# Patient Record
Sex: Female | Born: 2005 | Race: White | Hispanic: Yes | Marital: Single | Smoking: Current some day smoker
Health system: Southern US, Community
[De-identification: ages and names within clinical notes are randomized; demographics above are authoritative.]

## PROBLEM LIST (undated history)

## (undated) DIAGNOSIS — Z87440 Personal history of urinary (tract) infections: Secondary | ICD-10-CM

## (undated) DIAGNOSIS — N12 Tubulo-interstitial nephritis, not specified as acute or chronic: Secondary | ICD-10-CM

## (undated) DIAGNOSIS — T7840XA Allergy, unspecified, initial encounter: Secondary | ICD-10-CM

## (undated) HISTORY — PX: NO PAST SURGERIES: SHX2092

## (undated) HISTORY — DX: Allergy, unspecified, initial encounter: T78.40XA

## (undated) HISTORY — PX: APPENDECTOMY: SHX54

---
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
## 2023-01-05 DIAGNOSIS — Z419 Encounter for procedure for purposes other than remedying health state, unspecified: Secondary | ICD-10-CM | POA: Diagnosis not present

## 2023-01-25 ENCOUNTER — Telehealth: Payer: Self-pay | Admitting: Obstetrics and Gynecology

## 2023-01-25 NOTE — Telephone Encounter (Signed)
Left message for patient to call office back to r/s appt with Dr Amalia Hailey on 02/05/23. [facility] message sent

## 2023-02-01 DIAGNOSIS — Z20818 Contact with and (suspected) exposure to other bacterial communicable diseases: Secondary | ICD-10-CM | POA: Diagnosis not present

## 2023-02-01 DIAGNOSIS — N760 Acute vaginitis: Secondary | ICD-10-CM | POA: Diagnosis not present

## 2023-02-01 DIAGNOSIS — J029 Acute pharyngitis, unspecified: Secondary | ICD-10-CM | POA: Diagnosis not present

## 2023-02-01 IMAGING — DX DG CHEST 1V PORT
1 series · 1 of 1 positions shown · non-contrast
Comparison: 10/16/2005

CLINICAL DATA: Sepsis

EXAM:
PORTABLE CHEST 1 VIEW

[chest ap]
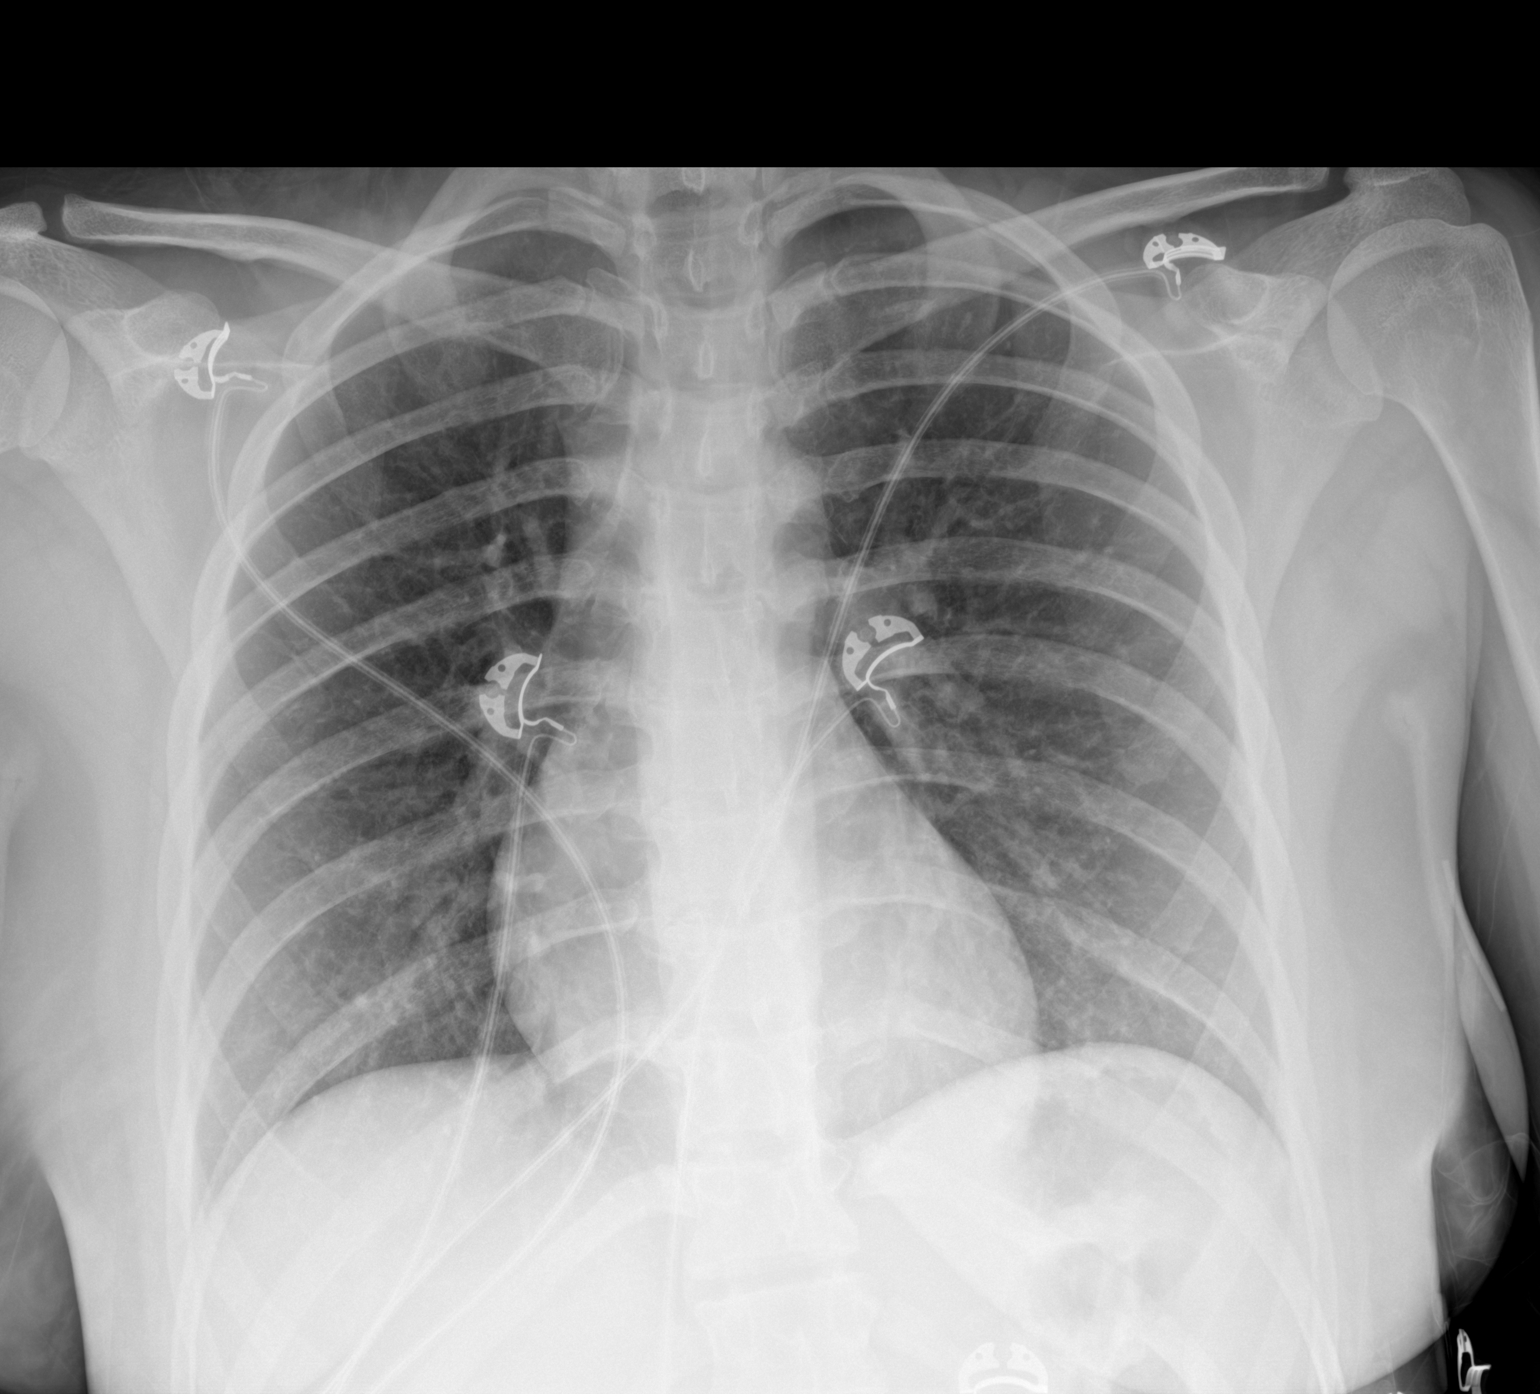

[1 of 1 positions shown; findings below may reference images not displayed]

FINDINGS: The heart size and mediastinal contours are within normal limits.
Both lungs are clear. The visualized skeletal structures are
unremarkable.
IMPRESSION: No active disease.

## 2023-02-01 IMAGING — CT CT ABD-PELV W/ CM
2 of 4 series · 16 of 46 positions shown, 18 images · IV contrast (omnipaque)
Comparison: None.

CLINICAL DATA: Fever, left upper quadrant abdominal pain

EXAM:
CT ABDOMEN AND PELVIS WITH CONTRAST
TECHNIQUE: Multidetector CT imaging of the abdomen and pelvis was performed
using the standard protocol following bolus administration of
intravenous contrast.
CONTRAST:  100mL OMNIPAQUE IOHEXOL 300 MG/ML  SOLN

[Series 2: routine abd/pel with · axial · 0.88mm/px · z∈[-1259,-759]mm · 13 of 110 slices shown, 15 images]
[im 5/110  soft-tissue]
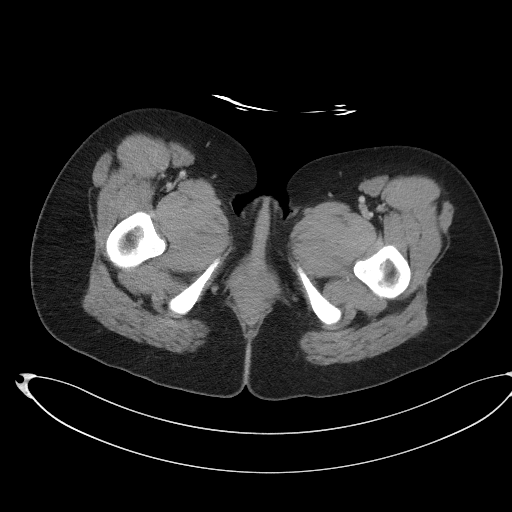
[im 5/110  bone]
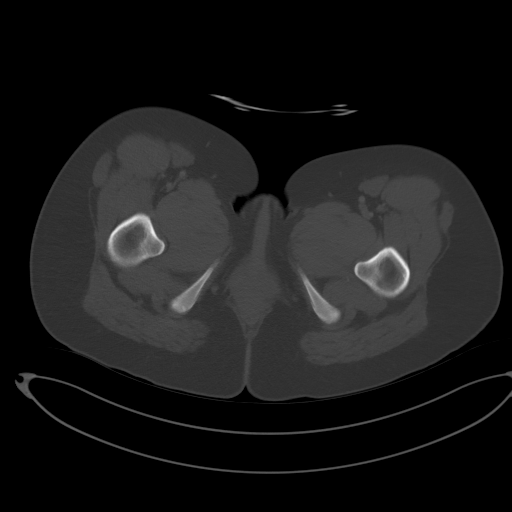
[im 14/110  soft-tissue]
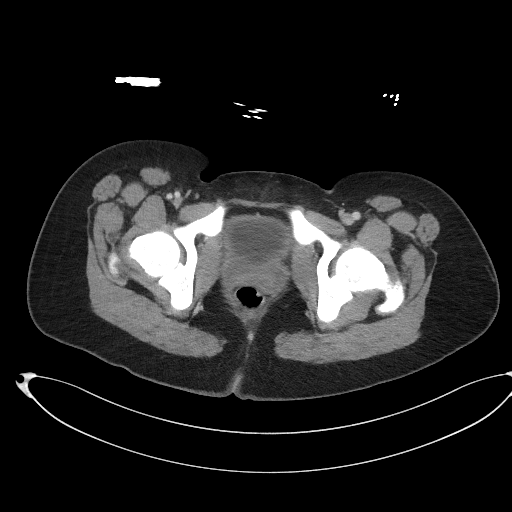
[im 22/110  soft-tissue]
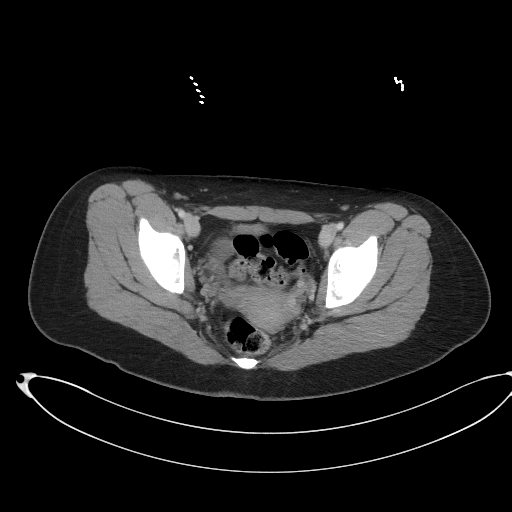
[im 31/110  soft-tissue]
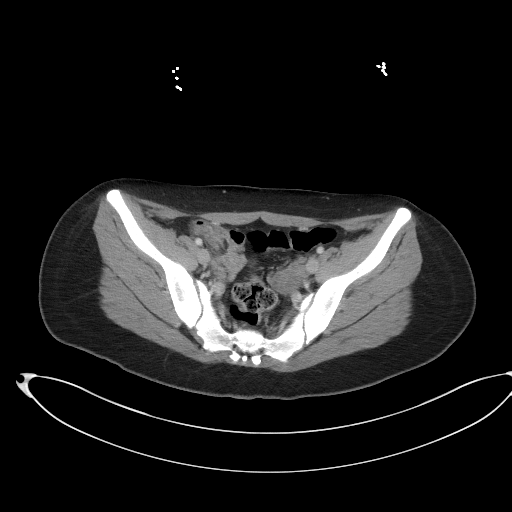
[im 40/110  soft-tissue]
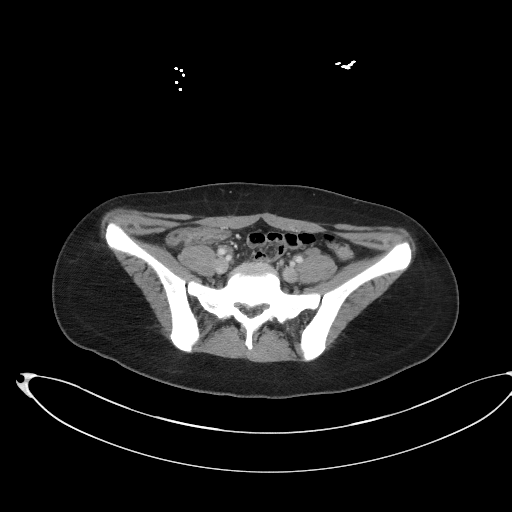
[im 48/110  soft-tissue]
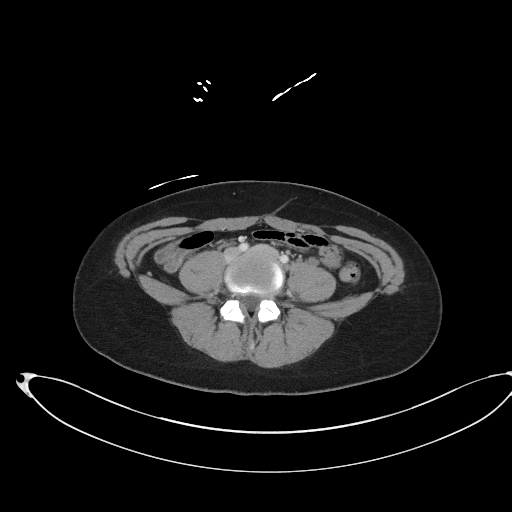
[im 57/110  soft-tissue]
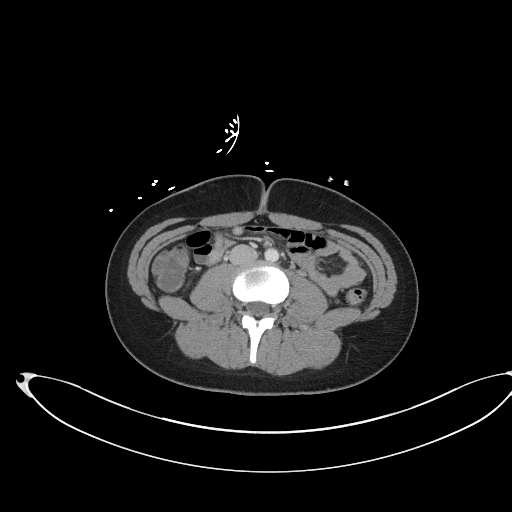
[im 62/110  soft-tissue]
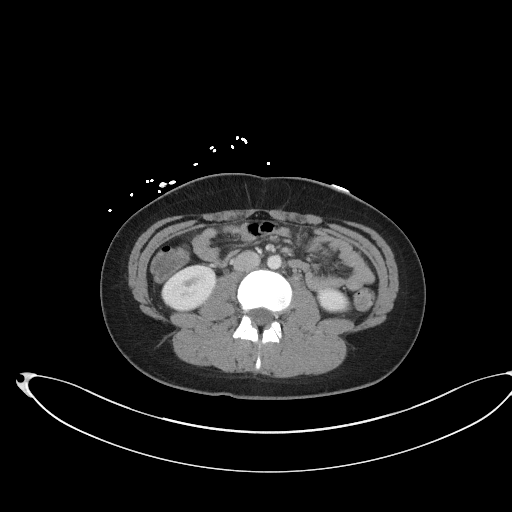
[im 70/110  soft-tissue]
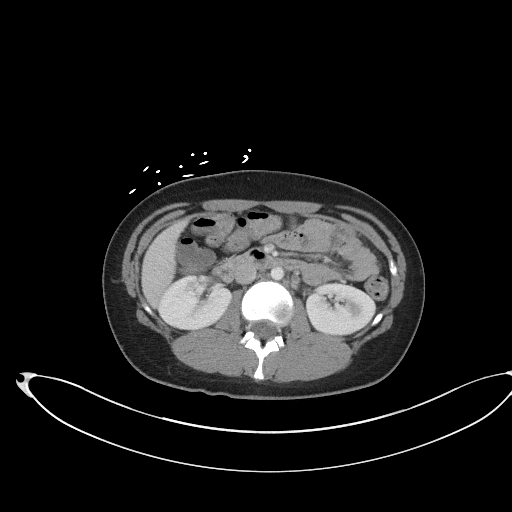
[im 70/110  bone]
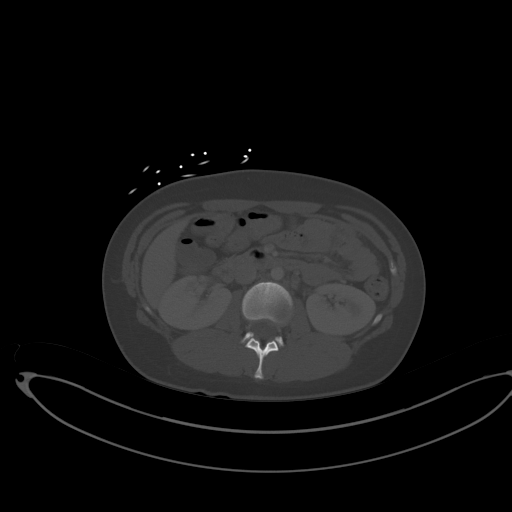
[im 79/110  soft-tissue]
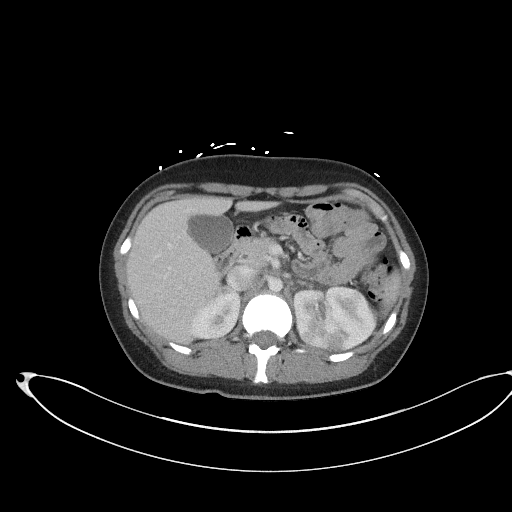
[im 88/110  soft-tissue]
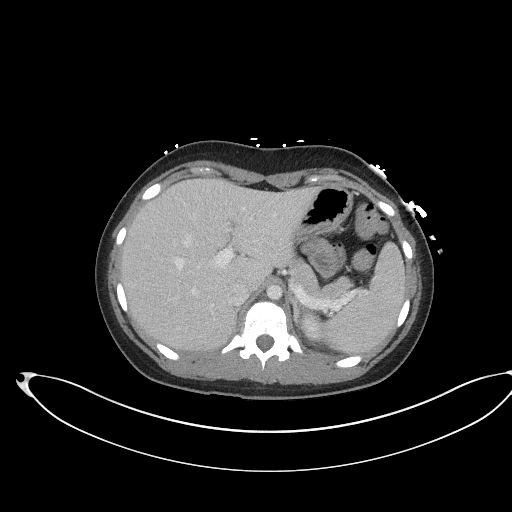
[im 96/110  soft-tissue]
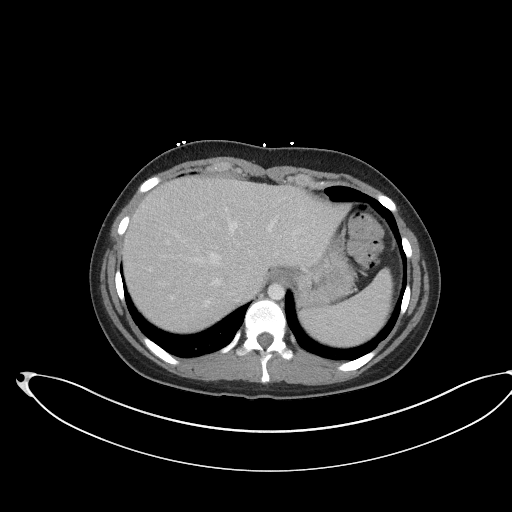
[im 105/110  soft-tissue]
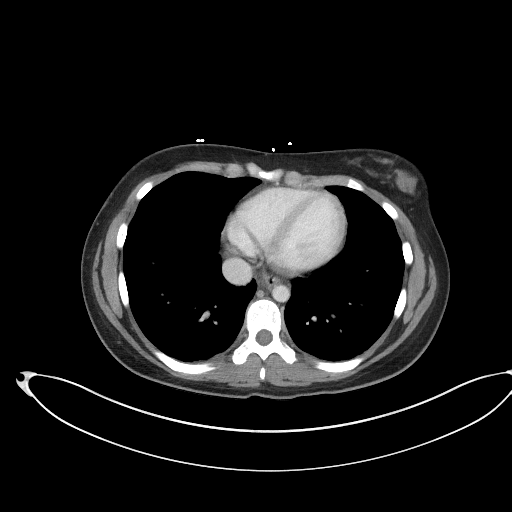

[Series 5: coronal st · coronal · 0.83mm/px · 3 of 89 slices shown]
[im 30/89  soft-tissue]
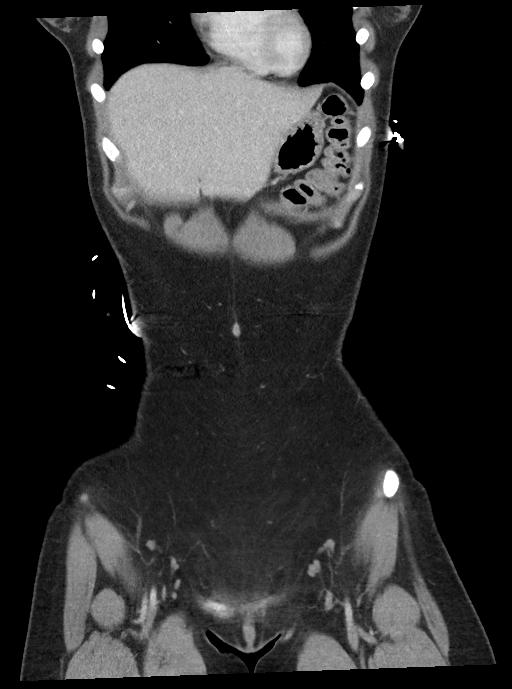
[im 40/89  soft-tissue]
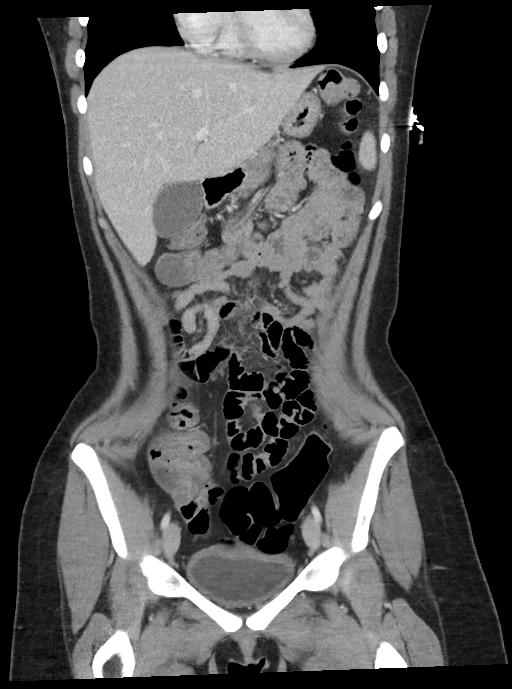
[im 49/89  soft-tissue]
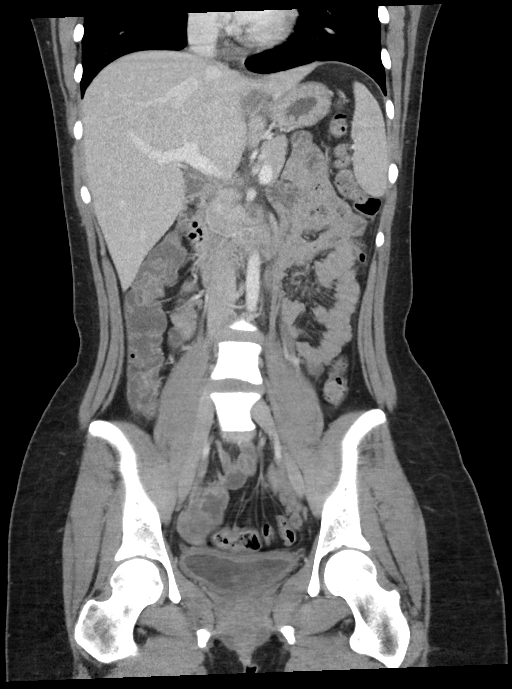

[16 of 46 positions shown; findings below may reference images not displayed]

FINDINGS: Lower chest: No acute abnormality.

Hepatobiliary: No focal liver abnormality is seen. No gallstones,
gallbladder wall thickening, or biliary dilatation.

Pancreas: Unremarkable

Spleen: Unremarkable

Adrenals/Urinary Tract: The adrenal glands are unremarkable. The
kidneys are normal in size and position. There is heterogeneous
cortical enhancement of the left kidney with areas of segmental
hypoenhancement most in keeping with changes of pyelonephritis.
Trace left perinephric inflammatory stranding. No hydronephrosis. No
intrarenal or ureteral calculi. The bladder wall is
circumferentially mildly thickened suggesting changes of underlying
cystitis.

Stomach/Bowel: Stomach is within normal limits. Appendix appears
normal. No evidence of bowel wall thickening, distention, or
inflammatory changes. No free intraperitoneal gas or fluid.

Vascular/Lymphatic: No significant vascular findings are present. No
enlarged abdominal or pelvic lymph nodes.

Reproductive: Uterus and bilateral adnexa are unremarkable.

Other: No abdominal wall hernia.  Rectum unremarkable.

Musculoskeletal: No acute bone abnormality. No lytic or blastic bone
lesions are identified.
IMPRESSION: Heterogeneous enhancement of the left kidney and circumferential
bladder wall thickening most in keeping with changes of
pyelonephritis/cystitis. Correlation with urinalysis and urine
culture is recommended.

## 2023-02-05 ENCOUNTER — Encounter: Payer: Medicaid Other | Admitting: Obstetrics and Gynecology

## 2023-02-05 DIAGNOSIS — Z419 Encounter for procedure for purposes other than remedying health state, unspecified: Secondary | ICD-10-CM | POA: Diagnosis not present

## 2023-03-07 DIAGNOSIS — Z419 Encounter for procedure for purposes other than remedying health state, unspecified: Secondary | ICD-10-CM | POA: Diagnosis not present

## 2023-04-04 ENCOUNTER — Other Ambulatory Visit: Payer: Self-pay

## 2023-04-04 ENCOUNTER — Emergency Department (HOSPITAL_BASED_OUTPATIENT_CLINIC_OR_DEPARTMENT_OTHER): Payer: Medicaid Other

## 2023-04-04 ENCOUNTER — Inpatient Hospital Stay (HOSPITAL_BASED_OUTPATIENT_CLINIC_OR_DEPARTMENT_OTHER)
Admission: EM | Admit: 2023-04-04 | Discharge: 2023-04-06 | DRG: 749 | Disposition: A | Discharge status: Home / Self Care | Payer: Medicaid Other | Attending: Surgery | Admitting: Surgery

## 2023-04-04 ENCOUNTER — Encounter (HOSPITAL_BASED_OUTPATIENT_CLINIC_OR_DEPARTMENT_OTHER): Payer: Self-pay | Admitting: Emergency Medicine

## 2023-04-04 DIAGNOSIS — K661 Hemoperitoneum: Secondary | ICD-10-CM | POA: Diagnosis present

## 2023-04-04 DIAGNOSIS — N3289 Other specified disorders of bladder: Secondary | ICD-10-CM | POA: Diagnosis not present

## 2023-04-04 DIAGNOSIS — N83202 Unspecified ovarian cyst, left side: Secondary | ICD-10-CM

## 2023-04-04 DIAGNOSIS — Z833 Family history of diabetes mellitus: Secondary | ICD-10-CM

## 2023-04-04 DIAGNOSIS — B9689 Other specified bacterial agents as the cause of diseases classified elsewhere: Secondary | ICD-10-CM | POA: Diagnosis not present

## 2023-04-04 DIAGNOSIS — R110 Nausea: Secondary | ICD-10-CM | POA: Diagnosis not present

## 2023-04-04 DIAGNOSIS — R1030 Lower abdominal pain, unspecified: Secondary | ICD-10-CM | POA: Diagnosis not present

## 2023-04-04 DIAGNOSIS — Z87440 Personal history of urinary (tract) infections: Secondary | ICD-10-CM

## 2023-04-04 DIAGNOSIS — N760 Acute vaginitis: Secondary | ICD-10-CM | POA: Diagnosis not present

## 2023-04-04 DIAGNOSIS — R102 Pelvic and perineal pain: Secondary | ICD-10-CM | POA: Diagnosis not present

## 2023-04-04 DIAGNOSIS — F32A Depression, unspecified: Secondary | ICD-10-CM | POA: Diagnosis present

## 2023-04-04 DIAGNOSIS — K529 Noninfective gastroenteritis and colitis, unspecified: Secondary | ICD-10-CM | POA: Diagnosis present

## 2023-04-04 DIAGNOSIS — F1290 Cannabis use, unspecified, uncomplicated: Secondary | ICD-10-CM | POA: Diagnosis present

## 2023-04-04 DIAGNOSIS — R109 Unspecified abdominal pain: Secondary | ICD-10-CM | POA: Diagnosis present

## 2023-04-04 DIAGNOSIS — Z823 Family history of stroke: Secondary | ICD-10-CM

## 2023-04-04 DIAGNOSIS — R1031 Right lower quadrant pain: Secondary | ICD-10-CM | POA: Diagnosis not present

## 2023-04-04 DIAGNOSIS — F17290 Nicotine dependence, other tobacco product, uncomplicated: Secondary | ICD-10-CM | POA: Diagnosis present

## 2023-04-04 DIAGNOSIS — N83292 Other ovarian cyst, left side: Principal | ICD-10-CM | POA: Diagnosis present

## 2023-04-04 LAB — URINALYSIS, MICROSCOPIC (REFLEX)

## 2023-04-04 LAB — URINALYSIS, ROUTINE W REFLEX MICROSCOPIC
Bilirubin Urine: NEGATIVE
Glucose, UA: NEGATIVE mg/dL
Ketones, ur: NEGATIVE mg/dL
Nitrite: NEGATIVE
Protein, ur: 30 mg/dL — AB
Specific Gravity, Urine: >=1.03 (ref 1.005–1.030)
pH: 7 (ref 5.0–8.0)

## 2023-04-04 LAB — LACTIC ACID, PLASMA
Lactic Acid, Venous: 1 mmol/L (ref 0.5–1.9)
Lactic Acid, Venous: 1.2 mmol/L (ref 0.5–1.9)

## 2023-04-04 LAB — COMPREHENSIVE METABOLIC PANEL
ALT: 14 U/L (ref 0–44)
AST: 17 U/L (ref 15–41)
Albumin: 3.9 g/dL (ref 3.5–5.0)
Alkaline Phosphatase: 59 U/L (ref 38–126)
Anion gap: 9 (ref 5–15)
BUN: 11 mg/dL (ref 6–20)
CO2: 25 mmol/L (ref 22–32)
Calcium: 9 mg/dL (ref 8.9–10.3)
Chloride: 103 mmol/L (ref 98–111)
Creatinine, Ser: 0.99 mg/dL (ref 0.44–1.00)
GFR, Estimated: >60 mL/min (ref >60)
Glucose, Bld: 125 mg/dL — ABNORMAL HIGH (ref 70–99)
Potassium: 3.9 mmol/L (ref 3.5–5.1)
Sodium: 137 mmol/L (ref 135–145)
Total Bilirubin: 0.8 mg/dL (ref 0.3–1.2)
Total Protein: 7.7 g/dL (ref 6.5–8.1)

## 2023-04-04 LAB — WET PREP, GENITAL
Sperm: NONE SEEN
Trich, Wet Prep: NONE SEEN
WBC, Wet Prep HPF POC: >=10 — AB (ref <10)
Yeast Wet Prep HPF POC: NONE SEEN

## 2023-04-04 LAB — CBC WITH DIFFERENTIAL/PLATELET
Abs Immature Granulocytes: 0.21 10*3/uL — ABNORMAL HIGH (ref 0.00–0.07)
Basophils Absolute: 0.1 10*3/uL (ref 0.0–0.1)
Basophils Relative: 0 %
Eosinophils Absolute: 0 10*3/uL (ref 0.0–0.5)
Eosinophils Relative: 0 %
HCT: 40.5 % (ref 36.0–46.0)
Hemoglobin: 13.2 g/dL (ref 12.0–15.0)
Immature Granulocytes: 1 %
Lymphocytes Relative: 4 %
Lymphs Abs: 1.1 10*3/uL (ref 0.7–4.0)
MCH: 31.4 pg (ref 26.0–34.0)
MCHC: 32.6 g/dL (ref 30.0–36.0)
MCV: 96.2 fL (ref 80.0–100.0)
Monocytes Absolute: 1.5 10*3/uL — ABNORMAL HIGH (ref 0.1–1.0)
Monocytes Relative: 5 %
Neutro Abs: 27.6 10*3/uL — ABNORMAL HIGH (ref 1.7–7.7)
Neutrophils Relative %: 90 %
Platelets: 279 10*3/uL (ref 150–400)
RBC: 4.21 MIL/uL (ref 3.87–5.11)
RDW: 11.9 % (ref 11.5–15.5)
Smear Review: NORMAL
WBC: 30.5 10*3/uL — ABNORMAL HIGH (ref 4.0–10.5)
nRBC: 0 % (ref 0.0–0.2)

## 2023-04-04 LAB — PREGNANCY, URINE: Preg Test, Ur: NEGATIVE

## 2023-04-04 MED ORDER — KETOROLAC TROMETHAMINE 15 MG/ML IJ SOLN
15.0000 mg | Freq: Once | INTRAMUSCULAR | Status: AC
  Administered 2023-04-04: 15 mg via INTRAVENOUS
  Filled 2023-04-04: qty 1

## 2023-04-04 MED ORDER — SODIUM CHLORIDE 0.9 % IV SOLN
2.0000 g | Freq: Once | INTRAVENOUS | Status: AC
  Administered 2023-04-04: 2 g via INTRAVENOUS
  Filled 2023-04-04: qty 20

## 2023-04-04 MED ORDER — MORPHINE SULFATE (PF) 4 MG/ML IV SOLN
4.0000 mg | Freq: Once | INTRAVENOUS | Status: AC
  Administered 2023-04-04: 4 mg via INTRAVENOUS
  Filled 2023-04-04: qty 1

## 2023-04-04 MED ORDER — ACETAMINOPHEN 500 MG PO TABS
1000.0000 mg | ORAL_TABLET | Freq: Four times a day (QID) | ORAL | Status: DC
  Administered 2023-04-04 – 2023-04-06 (×5): 1000 mg via ORAL
  Filled 2023-04-04 (×5): qty 2

## 2023-04-04 MED ORDER — IOHEXOL 300 MG/ML  SOLN
100.0000 mL | Freq: Once | INTRAMUSCULAR | Status: AC | PRN
  Administered 2023-04-04: 100 mL via INTRAVENOUS

## 2023-04-04 MED ORDER — DIPHENHYDRAMINE HCL 50 MG/ML IJ SOLN: 25.0000 mg | Freq: Four times a day (QID) | INTRAMUSCULAR | Status: DC | PRN

## 2023-04-04 MED ORDER — MORPHINE SULFATE (PF) 2 MG/ML IV SOLN
2.0000 mg | INTRAVENOUS | Status: DC | PRN
  Administered 2023-04-04 (×2): 2 mg via INTRAVENOUS
  Filled 2023-04-04 (×2): qty 1

## 2023-04-04 MED ORDER — IBUPROFEN 200 MG PO TABS
600.0000 mg | ORAL_TABLET | Freq: Four times a day (QID) | ORAL | Status: DC | PRN
  Administered 2023-04-04: 600 mg via ORAL
  Filled 2023-04-04: qty 3

## 2023-04-04 MED ORDER — TRAMADOL HCL 50 MG PO TABS: 50.0000 mg | ORAL_TABLET | Freq: Four times a day (QID) | ORAL | Status: DC | PRN

## 2023-04-04 MED ORDER — ONDANSETRON HCL 4 MG/2ML IJ SOLN
4.0000 mg | Freq: Once | INTRAMUSCULAR | Status: AC
  Administered 2023-04-04: 4 mg via INTRAVENOUS
  Filled 2023-04-04: qty 2

## 2023-04-04 MED ORDER — METRONIDAZOLE 500 MG/100ML IV SOLN: 500.0000 mg | Freq: Two times a day (BID) | INTRAVENOUS | Status: DC

## 2023-04-04 MED ORDER — SODIUM CHLORIDE 0.9 % IV BOLUS (SEPSIS)
1000.0000 mL | Freq: Once | INTRAVENOUS | Status: AC
  Administered 2023-04-04: 1000 mL via INTRAVENOUS

## 2023-04-04 MED ORDER — SODIUM CHLORIDE 0.9 % IV SOLN
2.0000 g | INTRAVENOUS | Status: DC
  Administered 2023-04-05 – 2023-04-06 (×2): 2 g via INTRAVENOUS
  Filled 2023-04-04 (×2): qty 20

## 2023-04-04 MED ORDER — ONDANSETRON 4 MG PO TBDP
4.0000 mg | ORAL_TABLET | Freq: Four times a day (QID) | ORAL | Status: DC | PRN
  Administered 2023-04-04: 4 mg via ORAL
  Filled 2023-04-04 (×2): qty 1

## 2023-04-04 MED ORDER — KETOROLAC TROMETHAMINE 30 MG/ML IJ SOLN
15.0000 mg | Freq: Once | INTRAMUSCULAR | Status: AC
  Administered 2023-04-04: 15 mg via INTRAVENOUS
  Filled 2023-04-04: qty 1

## 2023-04-04 MED ORDER — ACETAMINOPHEN 325 MG PO TABS: 650.0000 mg | ORAL_TABLET | Freq: Four times a day (QID) | ORAL | Status: DC | PRN

## 2023-04-04 MED ORDER — SODIUM CHLORIDE 0.9 % IV SOLN: 2.0000 g | INTRAVENOUS | Status: DC

## 2023-04-04 MED ORDER — DOCUSATE SODIUM 100 MG PO CAPS
100.0000 mg | ORAL_CAPSULE | Freq: Two times a day (BID) | ORAL | Status: DC
  Administered 2023-04-04 – 2023-04-06 (×5): 100 mg via ORAL
  Filled 2023-04-04 (×5): qty 1

## 2023-04-04 MED ORDER — SODIUM CHLORIDE 0.9 % IV SOLN: Freq: Once | INTRAVENOUS | Status: DC

## 2023-04-04 MED ORDER — ACETAMINOPHEN 650 MG RE SUPP: 650.0000 mg | Freq: Four times a day (QID) | RECTAL | Status: DC | PRN

## 2023-04-04 MED ORDER — LACTATED RINGERS IV BOLUS
1000.0000 mL | Freq: Once | INTRAVENOUS | Status: AC
Start: 2023-04-04 — End: 2023-04-04
  Administered 2023-04-04: 1000 mL via INTRAVENOUS

## 2023-04-04 MED ORDER — DIPHENHYDRAMINE HCL 25 MG PO CAPS: 25.0000 mg | ORAL_CAPSULE | Freq: Four times a day (QID) | ORAL | Status: DC | PRN

## 2023-04-04 MED ORDER — OXYCODONE HCL 5 MG PO TABS
5.0000 mg | ORAL_TABLET | ORAL | Status: DC | PRN
  Administered 2023-04-04 – 2023-04-05 (×2): 5 mg via ORAL
  Filled 2023-04-04 (×2): qty 1

## 2023-04-04 MED ORDER — METRONIDAZOLE 500 MG/100ML IV SOLN
500.0000 mg | Freq: Once | INTRAVENOUS | Status: AC
  Administered 2023-04-04: 500 mg via INTRAVENOUS
  Filled 2023-04-04: qty 100

## 2023-04-04 MED ORDER — ONDANSETRON HCL 4 MG/2ML IJ SOLN
4.0000 mg | Freq: Four times a day (QID) | INTRAMUSCULAR | Status: DC | PRN
  Administered 2023-04-05: 4 mg via INTRAVENOUS
  Filled 2023-04-04: qty 2

## 2023-04-04 MED ORDER — METRONIDAZOLE 500 MG/100ML IV SOLN
500.0000 mg | Freq: Two times a day (BID) | INTRAVENOUS | Status: DC
  Administered 2023-04-04 – 2023-04-05 (×3): 500 mg via INTRAVENOUS
  Filled 2023-04-04 (×4): qty 100

## 2023-04-04 MED ORDER — ONDANSETRON HCL 4 MG/2ML IJ SOLN: 4.0000 mg | Freq: Once | INTRAMUSCULAR | Status: DC

## 2023-04-04 NOTE — ED Notes (Signed)
Patient transported to CT 

## 2023-04-04 NOTE — ED Provider Notes (Addendum)
[place] [facility] Provider Note   CSN: [id number] Arrival date & time: 04/04/23  1308     History  Chief Complaint  Patient presents with   Abdominal Pain    Lisa Abbott is a 17 y.o. female.  With PMH of recurrent UTIs and previous pyelonephritis, anxiety, marijuana use who presents with lower abdominal pain.  Patient says she has been having stabbing and cramping lower abdominal pain worse on the right side since yesterday afternoon.  She has had no injuries to the abdomen.  She has had associated nausea but no vomiting.  She just darted her menstrual period.  She denies any abnormal vaginal discharge burning or itching.  She is sexually active without protection but denies any concern for STD or new partners.  She has had no fevers, no chills, no vomiting, no diarrhea.  She does not have dysuria.  She does not take any medicines for the pain.  She reports a history of pyelonephritis in the past.  She took a dose of leftover ciprofloxacin this morning.  No history of abdominal surgery.  Does not endorse any flank or back pain at this time.  No coughing or shortness of breath.   Abdominal Pain      Home Medications Prior to Admission medications   Medication Sig Start Date End Date Taking? Authorizing Provider  metroNIDAZOLE (FLAGYL) 500 MG tablet Take 1 tablet (500 mg total) by mouth 2 (two) times daily. Patient not taking: Reported on 04/04/2023 10/19/22   Mecum, Oswaldo Conroy, PA-C  venlafaxine XR (EFFEXOR XR) 37.5 MG 24 hr capsule Take 1 capsule (37.5 mg total) by mouth daily with breakfast. Patient not taking: Reported on 04/04/2023 10/16/22   Mecum, Erin E, PA-C      Allergies    Patient has no known allergies.    Review of Systems   Review of Systems  Gastrointestinal:  Positive for abdominal pain.    Physical Exam Updated Vital Signs BP 112/75 (BP Location: Left Arm)   Pulse 94   Temp 98.2 F (36.8 C) (Oral)   Resp 16   Ht 5\' 6"   (1.676 m)   Wt 72.6 kg   LMP 04/02/2023 (Approximate)   SpO2 99%   BMI 25.82 kg/m  Physical Exam Constitutional: Alert and oriented. Well appearing and in no distress. Eyes: Conjunctivae are normal. ENT      Head: Normocephalic and atraumatic. Cardiovascular: Mildly tachycardic, regular rhythm, warm well-perfused Respiratory: Normal respiratory effort. Breath sounds are normal.  O2 sat 98 on RA Gastrointestinal: Soft and nondistended with suprapubic, left lower quadrant and right lower quadrant tenderness on palpation.  Worst in the right lower quadrant with voluntary guarding.. There is no CVA tenderness. Musculoskeletal: Normal range of motion in all extremities.. Neurologic: Normal speech and language.  No facial droop.  Moving all 4 extremities equally.  Sensation grossly intact.  No gross focal neurologic deficits are appreciated. Skin: Skin is warm, dry and intact. No rash noted. Psychiatric: Mood and affect are normal. Speech and behavior are normal.  ED Results / Procedures / Treatments   Labs (all labs ordered are listed, but only abnormal results are displayed) Labs Reviewed  WET PREP, GENITAL - Abnormal; Notable for the following components:      Result Value   Clue Cells Wet Prep HPF POC PRESENT (*)    WBC, Wet Prep HPF POC >=10 (*)    All other components within normal limits  URINALYSIS, ROUTINE W  REFLEX MICROSCOPIC - Abnormal; Notable for the following components:   APPearance HAZY (*)    Hgb urine dipstick MODERATE (*)    Protein, ur 30 (*)    Leukocytes,Ua SMALL (*)    All other components within normal limits  URINALYSIS, MICROSCOPIC (REFLEX) - Abnormal; Notable for the following components:   Bacteria, UA RARE (*)    All other components within normal limits  COMPREHENSIVE METABOLIC PANEL - Abnormal; Notable for the following components:   Glucose, Bld 125 (*)    All other components within normal limits  CBC WITH DIFFERENTIAL/PLATELET - Abnormal; Notable  for the following components:   WBC 30.5 (*)    Neutro Abs 27.6 (*)    Monocytes Absolute 1.5 (*)    Abs Immature Granulocytes 0.21 (*)    All other components within normal limits  URINE CULTURE  CULTURE, BLOOD (ROUTINE X 2)  CULTURE, BLOOD (ROUTINE X 2)  PREGNANCY, URINE  LACTIC ACID, PLASMA  LACTIC ACID, PLASMA  GC/CHLAMYDIA PROBE AMP ([place]) NOT AT [facility]    EKG None  Radiology US PELVIC COMPLETE W TRANSVAGINAL AND TORSION R/O  Result Date: 04/04/2023 CLINICAL DATA:  Pelvic pain.  Same-day CT with left adnexal cyst EXAM: TRANSABDOMINAL AND TRANSVAGINAL ULTRASOUND OF PELVIS DOPPLER ULTRASOUND OF OVARIES TECHNIQUE: Both transabdominal and transvaginal ultrasound examinations of the pelvis were performed. Transabdominal technique was performed for global imaging of the pelvis including uterus, ovaries, adnexal regions, and pelvic cul-de-sac. It was necessary to proceed with endovaginal exam following the transabdominal exam to visualize the pelvic structures. Color and duplex Doppler ultrasound was utilized to evaluate blood flow to the ovaries. COMPARISON:  CT abdomen and pelvis dated 04/04/2019 FINDINGS: Uterus Measurements: 8.3 cm in sagittal dimension. No fibroids or other mass visualized. Endometrium Thickness: 13 mm.  Intrauterine device in-situ. Right ovary Measurements: 3.5 x 2.7 x 2.6 cm = volume: 12.3 mL. Normal appearance. No adnexal mass. Left ovary Measurements: 5.0 x 3.8 x 3.5 cm = volume: 34.9 mL. Contains a heterogeneously hypoechoic cyst measuring 2.6 x 2.0 x 1.4 cm with internal curvilinear echogenicities. Pulsed Doppler evaluation of both ovaries demonstrates normal low-resistance arterial and venous waveforms. Other findings Trace pelvic free fluid. IMPRESSION: 1. Left ovarian 2.6 cm hemorrhagic cyst. No specific follow-up imaging recommended. 2. No evidence of ovarian torsion. 3. Intrauterine device in-situ. Electronically Signed   By: Agustin Cree M.D.   On: 04/04/2023  10:17   CT ABDOMEN PELVIS W CONTRAST  Result Date: 04/04/2023 CLINICAL DATA:  Lower abdominal pain, worse on right side. Elevated white blood cell count. EXAM: CT ABDOMEN AND PELVIS WITH CONTRAST TECHNIQUE: Multidetector CT imaging of the abdomen and pelvis was performed using the standard protocol following bolus administration of intravenous contrast. RADIATION DOSE REDUCTION: This exam was performed according to the departmental dose-optimization program which includes automated exposure control, adjustment of the mA and/or kV according to patient size and/or use of iterative reconstruction technique. CONTRAST:  OMNIPAQUE IOHEXOL 300 MG/ML  SOLN COMPARISON:  05/01/2021 FINDINGS: Lower chest: Lung bases are clear. Hepatobiliary: Normal appearance of the liver, gallbladder and portal venous system. No biliary dilatation. Pancreas: Unremarkable. No pancreatic ductal dilatation or surrounding inflammatory changes. Spleen: Normal in size without focal abnormality. Adrenals/Urinary Tract: Normal adrenal glands. Normal appearance of both kidneys. No hydronephrosis. No evidence for kidney stonesand no suspicious renal lesion. Mild bladder wall thickening is similar to the previous examination. Stomach/Bowel: Normal appearance of the stomach. Minimal stranding in the right lower quadrant just anterior  to the cecum on image 71/2. The appendix is not clearly identified. No evidence for bowel dilatation or obstruction. Vascular/Lymphatic: No significant vascular findings are present. No enlarged abdominal or pelvic lymph nodes. Reproductive: 3.9 cm low-density structure involving the left adnexa likely represents an ovarian cyst. Limited evaluation of the right adnexa. IUD is present. Other: Trace fluid in the pelvis is likely physiologic. Negative for free air. Musculoskeletal: No acute bone abnormality. IMPRESSION: 1. Minimal stranding in the right lower quadrant just anterior to the cecum. The appendix is not  clearly identified. Findings are nonspecific. 2. 3.9 cm low-density structure in the left adnexa likely represents an ovarian cyst. 3. Trace fluid in the pelvis is likely physiologic. Electronically Signed   By: Richarda Overlie M.D.   On: 04/04/2023 08:54    Procedures .Critical Care  Performed by: Mardene Sayer, MD Authorized by: Mardene Sayer, MD   Critical care provider statement:    Critical care time (minutes):  35   Critical care was necessary to treat or prevent imminent or life-threatening deterioration of the following conditions:  Sepsis   Critical care was time spent personally by me on the following activities:  Development of treatment plan with patient or surrogate, discussions with consultants, evaluation of patient's response to treatment, examination of patient, ordering and review of laboratory studies, ordering and review of radiographic studies, ordering and performing treatments and interventions, pulse oximetry, re-evaluation of patient's condition, review of old charts and obtaining history from patient or surrogate   Care discussed with: admitting provider       Medications Ordered in ED Medications  0.9 %  sodium chloride infusion (0 mLs Intravenous Hold 04/04/23 1201)  cefTRIAXone (ROCEPHIN) 2 g in sodium chloride 0.9 % 100 mL IVPB (has no administration in time range)  metroNIDAZOLE (FLAGYL) IVPB 500 mg (has no administration in time range)  ketorolac (TORADOL) 15 MG/ML injection 15 mg (15 mg Intravenous Given 04/04/23 0736)  ondansetron (ZOFRAN) injection 4 mg (4 mg Intravenous Given 04/04/23 0736)  lactated ringers bolus 1,000 mL (0 mLs Intravenous Stopped 04/04/23 0921)  morphine (PF) 4 MG/ML injection 4 mg (4 mg Intravenous Given 04/04/23 0826)  iohexol (OMNIPAQUE) 300 MG/ML solution 100 mL (100 mLs Intravenous Contrast Given 04/04/23 0818)  cefTRIAXone (ROCEPHIN) 2 g in sodium chloride 0.9 % 100 mL IVPB (0 g Intravenous Stopped 04/04/23 1021)    And   metroNIDAZOLE (FLAGYL) IVPB 500 mg (0 mg Intravenous Stopped 04/04/23 1122)  ketorolac (TORADOL) 30 MG/ML injection 15 mg (15 mg Intravenous Given 04/04/23 1018)  morphine (PF) 4 MG/ML injection 4 mg (4 mg Intravenous Given 04/04/23 1018)  sodium chloride 0.9 % bolus 1,000 mL (0 mLs Intravenous Stopped 04/04/23 1227)  ondansetron (ZOFRAN) injection 4 mg (4 mg Intravenous Given 04/04/23 1133)    ED Course/ Medical Decision Making/ A&P Clinical Course as of 04/04/23 1328  Wed Apr 04, 2023  1027 Will repaged general surgery for recommendations for unclear appendicitis.  No acute appendicitis on CTAP but nonvisualized appendix with a leukocytosis of 30 and left shift present.  No other obvious source of infection other than possible UTI.  Surgery has been paged earlier and received call back from nurse in OR, waiting to hear back from general surgeon. [VB]  1029 Squamous Epithelial / HPF: 21-50 [VB]  1048 Dr Luisa Hart of general surgery has reviewed patient's case.  He recommends admission to [facility] under hospitalist service for continued IV antibiotics and general surgery team will consult.  Do not suspect consistent with acute appendicitis at this time but want continued observation and antibiotics. [VB]    Clinical Course User Index [VB] Mardene Sayer, MD                             Medical Decision Making  CAYDENCE SCHRIEFER is a 17 y.o. female.  With PMH of recurrent UTIs and previous pyelonephritis, anxiety, marijuana use who presents with lower abdominal pain.   Based on the patient's lower abdominal pain, differential includes but is not limited to appendicitis, UTI, GU ( women- ovarian torsion, ectopic, cyst rupture, PID) SBO, atypical diverticulitis, colitis, nephrolithiasis, pyelonephritis among multiple other etiologies.   Patient's workup notable for leukocytosis 30.5 with left shift.  She is tachycardic but otherwise hemodynamically stable, doubt septic shock at this  additionally with a normal lactate of 1.  Creatinine was 0.99 within normal limits.  No transaminitis noted.  UA was a dirty specimen with 21-50 squames present and rare bacteria 11-20 WBCs small leukocyte esterase, not necessarily consistent with UTI.  Swab consistent with positive BV with clue cells present and white blood cells present.  CT abdomen pelvis with IV contrast obtained which are personally reviewed with evidence of ovarian cyst on the left as well as stranding in the cecum but no obvious appendix visualized.  Followed up with transvaginal ultrasound with hemorrhagic cyst on left size 2.6 cm no evidence of ovarian torsion  Due to patient's significant leukocytosis and left shift and no obvious source of infection possible Cecitus or appendicitis, I covered patient with Rocephin and Flagyl to also cover BV/UTI.  Spoke with general surgery team.Dr Cornett of general surgery has reviewed patient's case.  He recommends admission to [facility] under hospitalist service for continued IV antibiotics and general surgery team will consult.  Do not suspect consistent with acute appendicitis at this time but want continued observation and antibiotics.  Hospitalist Dr. Allena Katz Requests surgery admission due to healthy female and possible surgical issue.  Thus admitted to surgery service for continued IV fluids and antibiotics and repeat abdominal exams. ---      Amount and/or Complexity of Data Reviewed Labs: ordered. Decision-making details documented in ED Course. Radiology: ordered.  Risk Prescription drug management. Decision regarding hospitalization.      Final Clinical Impression(s) / ED Diagnoses Final diagnoses:  Bacterial vaginosis  Hemorrhagic cyst of left ovary  Lower abdominal pain  Cecitis    Rx / DC Orders ED Discharge Orders     None         Mardene Sayer, MD 04/04/23 1328    Mardene Sayer, MD 04/04/23 1328

## 2023-04-04 NOTE — TOC CM/SW Note (Signed)
Transition of Care [facility]) - Inpatient Brief Assessment   Patient Details  Name: Lisa Abbott MRN: [id number] Date of Birth: 29-Jan-2006  [facility] CM/SW Contact:    Durenda Guthrie, RN Phone Number: 04/04/2023, 3:39 PM   Clinical Narrative:    Transition of Care Asessment: Insurance and Status: Insurance coverage has been reviewed Patient has primary care physician: Yes (states she sees Dr. at [facility]) Home environment has been reviewed: lives in an apartment Prior level of function:: independent prior to admission Prior/Current Home Services: Current home services Social Determinants of Health Reivew: SDOH reviewed no interventions necessary Readmission risk has been reviewed: Yes Transition of care needs: no transition of care needs at this time

## 2023-04-04 NOTE — H&P (Signed)
Lisa Abbott [birth year]  [id number].    Requesting MD: Elpidio Anis, MD Chief Complaint/Reason for Consult: abdominal pain, pelvic and RLQ inflammatory process on CT  HPI:  Lisa Abbott is an 17y/o F with PMH UTI, pyelonephritis, marijuana use and depression who presents with acute onset abdominal pain. She tells me that he pain started yesterday 5/28 at 12PM. Described as mild, tight, pain across her lower abdomen that got progressively worse. Later described as sharp, cramping, non-radiating.  Denies alleviating factors, tried tylenol and cipro. Associated symptoms include nausea. She denies fever, chills, diarrhea, melena, hematochezia. Denies new foods or recent travel. She is currently on her period. She denies vaginal discharge. She denies dysuria, hematuria. Denies a known family history of IBD or colon cancer.  She denies a history of abdominal surgery She currently lives in [place], [place] and is not in school, not employed. Reports vaping and smoking weed daily.  ROS: As above. Review of Systems  All other systems reviewed and are negative.   Family History  Problem Relation Age of Onset   Healthy Mother    Diabetes Maternal Grandfather    Stroke Maternal Great-grandmother     Past Medical History:  Diagnosis Date   History of recurrent UTIs    Pyelonephritis     Past Surgical History:  Procedure Laterality Date   NO PAST SURGERIES      Social History:  reports that she has never smoked. She has never used smokeless tobacco. She reports current alcohol use. She reports current drug use. Frequency: 7.00 times per week. Drug: Marijuana.  Allergies: No Known Allergies  (Not in a hospital admission)    Physical Exam: Blood pressure 112/75, pulse 94, temperature 98.2 F (36.8 C), temperature source Oral, resp. rate 16, height 5\' 6"  (1.676 m), weight 72.6 kg, last menstrual period 04/02/2023, SpO2 99 %. General: Pleasant female laying on hospital bed, appears stated  age, NAD. HEENT: head -normocephalic, atraumatic; Eyes: PERRLA, no conjunctival injection; anicteric sclerae  CV- RRR, normal S1/S2, no M/R/G, no lower extremity edema  Pulm- breathing is non-labored ORA, CTABL Abd- soft, mild tenderness over suprapubic region without guarding or peritonitis, appropriate bowel sounds in 4 quadrants, no masses, hernias, or organomegaly. GU- deferred  MSK- UE/LE symmetrical, no cyanosis, clubbing, or edema. Neuro- non-focal exam, gait not assessed  Psych- Alert and Oriented x3 with appropriate affect Skin: warm and dry, no rashes or lesions   Results for orders placed or performed during the hospital encounter of 04/04/23 (from the past 48 hour(s))  Pregnancy, urine     Status: None   Collection Time: 04/04/23  6:50 AM  Result Value Ref Range   Preg Test, Ur NEGATIVE NEGATIVE    Comment:        THE SENSITIVITY OF THIS METHODOLOGY IS >20 mIU/mL. Performed at [facility], [address] [place], [place] [address]   Urinalysis, Routine w reflex microscopic -Urine, Clean Catch     Status: Abnormal   Collection Time: 04/04/23  6:50 AM  Result Value Ref Range   Color, Urine YELLOW YELLOW   APPearance HAZY (A) CLEAR   Specific Gravity, Urine >=1.030 1.005 - 1.030   pH 7.0 5.0 - 8.0   Glucose, UA NEGATIVE NEGATIVE mg/dL   Hgb urine dipstick MODERATE (A) NEGATIVE   Bilirubin Urine NEGATIVE NEGATIVE   Ketones, ur NEGATIVE NEGATIVE mg/dL   Protein, ur 30 (A) NEGATIVE mg/dL   Nitrite NEGATIVE NEGATIVE   Leukocytes,Ua SMALL (  A) NEGATIVE    Comment: Performed at [facility], [address] [place], [place] [address]  Urinalysis, Microscopic (reflex)     Status: Abnormal   Collection Time: 04/04/23  6:50 AM  Result Value Ref Range   RBC / HPF 0-5 0 - 5 RBC/hpf   WBC, UA 11-20 0 - 5 WBC/hpf   Bacteria, UA RARE (A) NONE SEEN   Squamous Epithelial / HPF 21-50 0 - 5 /HPF   Mucus PRESENT     Comment: Performed at [facility], [address] [place], [place] [address]  Comprehensive metabolic panel     Status: Abnormal   Collection Time: 04/04/23  7:35 AM  Result Value Ref Range   Sodium 137 135 - 145 mmol/L   Potassium 3.9 3.5 - 5.1 mmol/L   Chloride 103 98 - 111 mmol/L   CO2 25 22 - 32 mmol/L   Glucose, Bld 125 (H) 70 - 99 mg/dL    Comment: Glucose reference range applies only to samples taken after fasting for at least 8 hours.   BUN 11 6 - 20 mg/dL   Creatinine, Ser 0.98 0.44 - 1.00 mg/dL   Calcium 9.0 8.9 - 11.9 mg/dL   Total Protein 7.7 6.5 - 8.1 g/dL   Albumin 3.9 3.5 - 5.0 g/dL   AST 17 15 - 41 U/L   ALT 14 0 - 44 U/L   Alkaline Phosphatase 59 38 - 126 U/L   Total Bilirubin 0.8 0.3 - 1.2 mg/dL   GFR, Estimated >14 >78 mL/min    Comment: (NOTE) Calculated using the CKD-EPI Creatinine Equation (2021)    Anion gap 9 5 - 15    Comment: Performed at [facility], [address] [place], [place] [address]  CBC with Differential     Status: Abnormal   Collection Time: 04/04/23  7:35 AM  Result Value Ref Range   WBC 30.5 (H) 4.0 - 10.5 K/uL   RBC 4.21 3.87 - 5.11 MIL/uL   Hemoglobin 13.2 12.0 - 15.0 g/dL   HCT 13.0 86.5 - 78.4 %   MCV 96.2 80.0 - 100.0 fL   MCH 31.4 26.0 - 34.0 pg   MCHC 32.6 30.0 - 36.0 g/dL   RDW 69.6 29.5 - 28.4 %   Platelets 279 150 - 400 K/uL   nRBC 0.0 0.0 - 0.2 %   Neutrophils Relative % 90 %   Neutro Abs 27.6 (H) 1.7 - 7.7 K/uL   Lymphocytes Relative 4 %   Lymphs Abs 1.1 0.7 - 4.0 K/uL   Monocytes Relative 5 %   Monocytes Absolute 1.5 (H) 0.1 - 1.0 K/uL   Eosinophils Relative 0 %   Eosinophils Absolute 0.0 0.0 - 0.5 K/uL   Basophils Relative 0 %   Basophils Absolute 0.1 0.0 - 0.1 K/uL   Smear Review Normal platelet morphology    Immature Granulocytes 1 %   Abs Immature Granulocytes 0.21 (H) 0.00 - 0.07 K/uL   Stomatocytes PRESENT     Comment: Performed at [facility], [address] [place], [place] [address]  Wet prep, genital      Status: Abnormal   Collection Time: 04/04/23  8:11 AM  Result Value Ref Range   Yeast Wet Prep HPF POC NONE SEEN NONE SEEN   Trich, Wet Prep NONE SEEN NONE SEEN   Clue Cells Wet Prep HPF POC PRESENT (A) NONE SEEN   WBC,  Wet Prep HPF POC >=10 (A) <10   Sperm NONE SEEN     Comment: Performed at [facility], [address] [place], [place] [address]   US PELVIC COMPLETE W TRANSVAGINAL AND TORSION R/O  Result Date: 04/04/2023 CLINICAL DATA:  Pelvic pain.  Same-day CT with left adnexal cyst EXAM: TRANSABDOMINAL AND TRANSVAGINAL ULTRASOUND OF PELVIS DOPPLER ULTRASOUND OF OVARIES TECHNIQUE: Both transabdominal and transvaginal ultrasound examinations of the pelvis were performed. Transabdominal technique was performed for global imaging of the pelvis including uterus, ovaries, adnexal regions, and pelvic cul-de-sac. It was necessary to proceed with endovaginal exam following the transabdominal exam to visualize the pelvic structures. Color and duplex Doppler ultrasound was utilized to evaluate blood flow to the ovaries. COMPARISON:  CT abdomen and pelvis dated 04/04/2019 FINDINGS: Uterus Measurements: 8.3 cm in sagittal dimension. No fibroids or other mass visualized. Endometrium Thickness: 13 mm.  Intrauterine device in-situ. Right ovary Measurements: 3.5 x 2.7 x 2.6 cm = volume: 12.3 mL. Normal appearance. No adnexal mass. Left ovary Measurements: 5.0 x 3.8 x 3.5 cm = volume: 34.9 mL. Contains a heterogeneously hypoechoic cyst measuring 2.6 x 2.0 x 1.4 cm with internal curvilinear echogenicities. Pulsed Doppler evaluation of both ovaries demonstrates normal low-resistance arterial and venous waveforms. Other findings Trace pelvic free fluid. IMPRESSION: 1. Left ovarian 2.6 cm hemorrhagic cyst. No specific follow-up imaging recommended. 2. No evidence of ovarian torsion. 3. Intrauterine device in-situ. Electronically Signed   By: Agustin Cree M.D.   On: 04/04/2023 10:17   CT ABDOMEN PELVIS W  CONTRAST  Result Date: 04/04/2023 CLINICAL DATA:  Lower abdominal pain, worse on right side. Elevated white blood cell count. EXAM: CT ABDOMEN AND PELVIS WITH CONTRAST TECHNIQUE: Multidetector CT imaging of the abdomen and pelvis was performed using the standard protocol following bolus administration of intravenous contrast. RADIATION DOSE REDUCTION: This exam was performed according to the departmental dose-optimization program which includes automated exposure control, adjustment of the mA and/or kV according to patient size and/or use of iterative reconstruction technique. CONTRAST:  OMNIPAQUE IOHEXOL 300 MG/ML  SOLN COMPARISON:  05/01/2021 FINDINGS: Lower chest: Lung bases are clear. Hepatobiliary: Normal appearance of the liver, gallbladder and portal venous system. No biliary dilatation. Pancreas: Unremarkable. No pancreatic ductal dilatation or surrounding inflammatory changes. Spleen: Normal in size without focal abnormality. Adrenals/Urinary Tract: Normal adrenal glands. Normal appearance of both kidneys. No hydronephrosis. No evidence for kidney stonesand no suspicious renal lesion. Mild bladder wall thickening is similar to the previous examination. Stomach/Bowel: Normal appearance of the stomach. Minimal stranding in the right lower quadrant just anterior to the cecum on image 71/2. The appendix is not clearly identified. No evidence for bowel dilatation or obstruction. Vascular/Lymphatic: No significant vascular findings are present. No enlarged abdominal or pelvic lymph nodes. Reproductive: 3.9 cm low-density structure involving the left adnexa likely represents an ovarian cyst. Limited evaluation of the right adnexa. IUD is present. Other: Trace fluid in the pelvis is likely physiologic. Negative for free air. Musculoskeletal: No acute bone abnormality. IMPRESSION: 1. Minimal stranding in the right lower quadrant just anterior to the cecum. The appendix is not clearly identified. Findings are  nonspecific. 2. 3.9 cm low-density structure in the left adnexa likely represents an ovarian cyst. 3. Trace fluid in the pelvis is likely physiologic. Electronically Signed   By: Richarda Overlie M.D.   On: 04/04/2023 08:54      Assessment/Plan 17 y/o F with acute onset suprapubic and RLQ pain associated with  nausea - afebrile, WBC 30 - CT abdomen pelvis 5/29 with minimal stranding in RLQ anterior to the cecum, appendix is not clearly visualized. Left adnexal cyst is present, not visible on scan from 2022.  - based on history, exam, and imaging I have a low suspicion for appendicitis. Unclear etiology of her pain. UA with with leuks, urine culture pending, it is possible this is an infection of the urinary tract given her history. Wet prep negative for trichomonas but has some clue cells (?BV).  - no emergent need for surgery. Monitor on IV abx (rocephin/flagyl). Failure to improve in 24h may warrant diagnostic laparoscopy     I reviewed nursing notes, ED provider notes, last 24 h vitals and pain scores, last 48 h intake and output, last 24 h labs and trends, and last 24 h imaging results.  Adam Phenix, [facility] 04/04/2023, 11:47 AM Please see Amion for pager number during day hours 7:00am-4:30pm or 7:00am -11:30am on weekends

## 2023-04-04 NOTE — ED Triage Notes (Signed)
Lower abdominal pain since 1300 on 5/29, denies injury. 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 Took some cipro that was left of from previous and tylenol.

## 2023-04-05 ENCOUNTER — Encounter (HOSPITAL_COMMUNITY): Admission: EM | Disposition: A | Discharge status: Home / Self Care | Payer: Self-pay | Source: Home / Self Care

## 2023-04-05 ENCOUNTER — Inpatient Hospital Stay (HOSPITAL_COMMUNITY): Payer: Medicaid Other | Admitting: Anesthesiology

## 2023-04-05 ENCOUNTER — Other Ambulatory Visit: Payer: Self-pay

## 2023-04-05 ENCOUNTER — Encounter (HOSPITAL_COMMUNITY): Payer: Self-pay

## 2023-04-05 DIAGNOSIS — N3289 Other specified disorders of bladder: Secondary | ICD-10-CM | POA: Diagnosis not present

## 2023-04-05 DIAGNOSIS — Z87440 Personal history of urinary (tract) infections: Secondary | ICD-10-CM | POA: Diagnosis not present

## 2023-04-05 DIAGNOSIS — K3589 Other acute appendicitis without perforation or gangrene: Secondary | ICD-10-CM | POA: Diagnosis not present

## 2023-04-05 DIAGNOSIS — F418 Other specified anxiety disorders: Secondary | ICD-10-CM

## 2023-04-05 DIAGNOSIS — K661 Hemoperitoneum: Secondary | ICD-10-CM | POA: Diagnosis not present

## 2023-04-05 DIAGNOSIS — B9689 Other specified bacterial agents as the cause of diseases classified elsewhere: Secondary | ICD-10-CM | POA: Diagnosis not present

## 2023-04-05 DIAGNOSIS — F32A Depression, unspecified: Secondary | ICD-10-CM | POA: Diagnosis not present

## 2023-04-05 DIAGNOSIS — Z823 Family history of stroke: Secondary | ICD-10-CM | POA: Diagnosis not present

## 2023-04-05 DIAGNOSIS — N83209 Unspecified ovarian cyst, unspecified side: Secondary | ICD-10-CM | POA: Diagnosis not present

## 2023-04-05 DIAGNOSIS — F1290 Cannabis use, unspecified, uncomplicated: Secondary | ICD-10-CM | POA: Diagnosis not present

## 2023-04-05 DIAGNOSIS — Z833 Family history of diabetes mellitus: Secondary | ICD-10-CM | POA: Diagnosis not present

## 2023-04-05 DIAGNOSIS — R1030 Lower abdominal pain, unspecified: Secondary | ICD-10-CM | POA: Diagnosis not present

## 2023-04-05 DIAGNOSIS — K658 Other peritonitis: Secondary | ICD-10-CM | POA: Diagnosis not present

## 2023-04-05 DIAGNOSIS — F17290 Nicotine dependence, other tobacco product, uncomplicated: Secondary | ICD-10-CM | POA: Diagnosis not present

## 2023-04-05 DIAGNOSIS — N83202 Unspecified ovarian cyst, left side: Secondary | ICD-10-CM | POA: Diagnosis not present

## 2023-04-05 DIAGNOSIS — R102 Pelvic and perineal pain: Secondary | ICD-10-CM | POA: Diagnosis not present

## 2023-04-05 DIAGNOSIS — N83292 Other ovarian cyst, left side: Secondary | ICD-10-CM | POA: Diagnosis not present

## 2023-04-05 DIAGNOSIS — N760 Acute vaginitis: Secondary | ICD-10-CM | POA: Diagnosis not present

## 2023-04-05 HISTORY — PX: LAPAROSCOPY: SHX197

## 2023-04-05 LAB — CBC
HCT: 38.2 % (ref 36.0–46.0)
Hemoglobin: 12.4 g/dL (ref 12.0–15.0)
MCH: 31.8 pg (ref 26.0–34.0)
MCHC: 32.5 g/dL (ref 30.0–36.0)
MCV: 97.9 fL (ref 80.0–100.0)
Platelets: 240 10*3/uL (ref 150–400)
RBC: 3.9 MIL/uL (ref 3.87–5.11)
RDW: 11.9 % (ref 11.5–15.5)
WBC: 24 10*3/uL — ABNORMAL HIGH (ref 4.0–10.5)
nRBC: 0 % (ref 0.0–0.2)

## 2023-04-05 LAB — GC/CHLAMYDIA PROBE AMP (~~LOC~~) NOT AT ARMC
Chlamydia: NEGATIVE
Comment: NEGATIVE
Comment: NORMAL
Neisseria Gonorrhea: POSITIVE — AB

## 2023-04-05 LAB — CULTURE, BLOOD (ROUTINE X 2)
Special Requests: ADEQUATE
Special Requests: ADEQUATE

## 2023-04-05 LAB — BASIC METABOLIC PANEL
Anion gap: 7 (ref 5–15)
BUN: 7 mg/dL (ref 6–20)
CO2: 25 mmol/L (ref 22–32)
Calcium: 8.3 mg/dL — ABNORMAL LOW (ref 8.9–10.3)
Chloride: 105 mmol/L (ref 98–111)
Creatinine, Ser: 0.98 mg/dL (ref 0.44–1.00)
GFR, Estimated: >60 mL/min (ref >60)
Glucose, Bld: 110 mg/dL — ABNORMAL HIGH (ref 70–99)
Potassium: 3.6 mmol/L (ref 3.5–5.1)
Sodium: 137 mmol/L (ref 135–145)

## 2023-04-05 LAB — URINE CULTURE: Culture: NO GROWTH

## 2023-04-05 LAB — HIV ANTIBODY (ROUTINE TESTING W REFLEX): HIV Screen 4th Generation wRfx: NONREACTIVE

## 2023-04-05 SURGERY — LAPAROSCOPY, DIAGNOSTIC
Anesthesia: General

## 2023-04-05 MED ORDER — SCOPOLAMINE 1 MG/3DAYS TD PT72
1.0000 | MEDICATED_PATCH | TRANSDERMAL | Status: DC
  Administered 2023-04-05: 1.5 mg via TRANSDERMAL
  Filled 2023-04-05: qty 1

## 2023-04-05 MED ORDER — AMISULPRIDE (ANTIEMETIC) 5 MG/2ML IV SOLN: 10.0000 mg | Freq: Once | INTRAVENOUS | Status: DC | PRN

## 2023-04-05 MED ORDER — FENTANYL CITRATE (PF) 100 MCG/2ML IJ SOLN
INTRAMUSCULAR | Status: DC | PRN
  Administered 2023-04-05: 50 ug via INTRAVENOUS
  Administered 2023-04-05: 100 ug via INTRAVENOUS
  Administered 2023-04-05: 50 ug via INTRAVENOUS

## 2023-04-05 MED ORDER — LACTATED RINGERS IV SOLN: INTRAVENOUS | Status: DC | PRN

## 2023-04-05 MED ORDER — BUPIVACAINE HCL (PF) 0.5 % IJ SOLN
INTRAMUSCULAR | Status: DC | PRN
  Administered 2023-04-05: 30 mL

## 2023-04-05 MED ORDER — ROCURONIUM BROMIDE 10 MG/ML (PF) SYRINGE
PREFILLED_SYRINGE | INTRAVENOUS | Status: AC
  Filled 2023-04-05: qty 10

## 2023-04-05 MED ORDER — PROPOFOL 10 MG/ML IV BOLUS
INTRAVENOUS | Status: DC | PRN
  Administered 2023-04-05: 170 mg via INTRAVENOUS

## 2023-04-05 MED ORDER — MIDAZOLAM HCL 2 MG/2ML IJ SOLN
INTRAMUSCULAR | Status: DC | PRN
  Administered 2023-04-05: 2 mg via INTRAVENOUS

## 2023-04-05 MED ORDER — ACETAMINOPHEN 500 MG PO TABS: 1000.0000 mg | ORAL_TABLET | Freq: Once | ORAL | Status: DC

## 2023-04-05 MED ORDER — DEXMEDETOMIDINE HCL IN NACL 80 MCG/20ML IV SOLN
INTRAVENOUS | Status: DC | PRN
  Administered 2023-04-05 (×2): 8 ug via INTRAVENOUS

## 2023-04-05 MED ORDER — FENTANYL CITRATE (PF) 100 MCG/2ML IJ SOLN
INTRAMUSCULAR | Status: AC
  Filled 2023-04-05: qty 2

## 2023-04-05 MED ORDER — DEXAMETHASONE SODIUM PHOSPHATE 10 MG/ML IJ SOLN
INTRAMUSCULAR | Status: AC
  Filled 2023-04-05: qty 1

## 2023-04-05 MED ORDER — PROPOFOL 10 MG/ML IV BOLUS
INTRAVENOUS | Status: AC
  Filled 2023-04-05: qty 20

## 2023-04-05 MED ORDER — FENTANYL CITRATE PF 50 MCG/ML IJ SOSY
25.0000 ug | PREFILLED_SYRINGE | INTRAMUSCULAR | Status: DC | PRN
  Administered 2023-04-05 (×3): 50 ug via INTRAVENOUS

## 2023-04-05 MED ORDER — DEXAMETHASONE SODIUM PHOSPHATE 10 MG/ML IJ SOLN
INTRAMUSCULAR | Status: DC | PRN
  Administered 2023-04-05: 10 mg via INTRAVENOUS

## 2023-04-05 MED ORDER — MIDAZOLAM HCL 2 MG/2ML IJ SOLN
INTRAMUSCULAR | Status: AC
  Filled 2023-04-05: qty 2

## 2023-04-05 MED ORDER — OXYCODONE HCL 5 MG PO TABS
5.0000 mg | ORAL_TABLET | ORAL | Status: DC | PRN
  Administered 2023-04-05 – 2023-04-06 (×2): 5 mg via ORAL
  Filled 2023-04-05 (×3): qty 1

## 2023-04-05 MED ORDER — LIDOCAINE HCL (PF) 2 % IJ SOLN
INTRAMUSCULAR | Status: AC
  Filled 2023-04-05: qty 5

## 2023-04-05 MED ORDER — DEXMEDETOMIDINE HCL IN NACL 80 MCG/20ML IV SOLN
INTRAVENOUS | Status: AC
  Filled 2023-04-05: qty 20

## 2023-04-05 MED ORDER — FENTANYL CITRATE PF 50 MCG/ML IJ SOSY
PREFILLED_SYRINGE | INTRAMUSCULAR | Status: AC
  Filled 2023-04-05: qty 2

## 2023-04-05 MED ORDER — ROCURONIUM BROMIDE 10 MG/ML (PF) SYRINGE
PREFILLED_SYRINGE | INTRAVENOUS | Status: DC | PRN
  Administered 2023-04-05: 60 mg via INTRAVENOUS

## 2023-04-05 MED ORDER — LACTATED RINGERS IV SOLN
INTRAVENOUS | Status: AC | PRN
  Administered 2023-04-05: 1000 mL

## 2023-04-05 MED ORDER — KETOROLAC TROMETHAMINE 30 MG/ML IJ SOLN
INTRAMUSCULAR | Status: DC | PRN
  Administered 2023-04-05: 30 mg via INTRAVENOUS

## 2023-04-05 MED ORDER — ONDANSETRON HCL 4 MG/2ML IJ SOLN
INTRAMUSCULAR | Status: DC | PRN
  Administered 2023-04-05: 4 mg via INTRAVENOUS

## 2023-04-05 MED ORDER — BUPIVACAINE HCL (PF) 0.5 % IJ SOLN
INTRAMUSCULAR | Status: AC
  Filled 2023-04-05: qty 30

## 2023-04-05 MED ORDER — LIDOCAINE 2% (20 MG/ML) 5 ML SYRINGE
INTRAMUSCULAR | Status: DC | PRN
  Administered 2023-04-05: 100 mg via INTRAVENOUS

## 2023-04-05 MED ORDER — PROMETHAZINE HCL 25 MG/ML IJ SOLN: 6.2500 mg | INTRAMUSCULAR | Status: DC | PRN

## 2023-04-05 MED ORDER — ONDANSETRON HCL 4 MG/2ML IJ SOLN
INTRAMUSCULAR | Status: AC
  Filled 2023-04-05: qty 2

## 2023-04-05 MED ORDER — SUGAMMADEX SODIUM 200 MG/2ML IV SOLN
INTRAVENOUS | Status: DC | PRN
  Administered 2023-04-05: 200 mg via INTRAVENOUS

## 2023-04-05 MED ORDER — KETOROLAC TROMETHAMINE 30 MG/ML IJ SOLN
INTRAMUSCULAR | Status: AC
  Filled 2023-04-05: qty 1

## 2023-04-05 MED ORDER — FENTANYL CITRATE PF 50 MCG/ML IJ SOSY
PREFILLED_SYRINGE | INTRAMUSCULAR | Status: AC
  Filled 2023-04-05: qty 1

## 2023-04-05 SURGICAL SUPPLY — 31 items
BAG COUNTER SPONGE SURGICOUNT (BAG) IMPLANT
CUTTER FLEX LINEAR 45M (STAPLE) IMPLANT
DERMABOND ADVANCED .7 DNX12 (GAUZE/BANDAGES/DRESSINGS) IMPLANT
DEVICE SUTURE ENDOST 10MM (ENDOMECHANICALS) IMPLANT
DRAPE WARM FLUID 44X44 (DRAPES) ×1 IMPLANT
ELECT REM PT RETURN 15FT ADLT (MISCELLANEOUS) ×1 IMPLANT
GLOVE BIOGEL PI IND STRL 7.0 (GLOVE) ×1 IMPLANT
GLOVE INDICATOR 8.0 STRL GRN (GLOVE) ×2 IMPLANT
GLOVE SS BIOGEL STRL SZ 7.5 (GLOVE) ×1 IMPLANT
GOWN STRL REUS W/ TWL XL LVL3 (GOWN DISPOSABLE) ×2 IMPLANT
GOWN STRL REUS W/TWL XL LVL3 (GOWN DISPOSABLE) ×2
IRRIG SUCT STRYKERFLOW 2 WTIP (MISCELLANEOUS) ×1
IRRIGATION SUCT STRKRFLW 2 WTP (MISCELLANEOUS) ×1 IMPLANT
KIT BASIN OR (CUSTOM PROCEDURE TRAY) ×1 IMPLANT
KIT TURNOVER KIT A (KITS) IMPLANT
POUCH RETRIEVAL ECOSAC 10 (ENDOMECHANICALS) IMPLANT
RELOAD STAPLE 45 3.5 BLU ETS (ENDOMECHANICALS) IMPLANT
RELOAD STAPLE TA45 3.5 REG BLU (ENDOMECHANICALS) ×1 IMPLANT
SCISSORS LAP 5X35 DISP (ENDOMECHANICALS) IMPLANT
SET TUBE SMOKE EVAC HIGH FLOW (TUBING) ×1 IMPLANT
SHEARS HARMONIC ACE PLUS 36CM (ENDOMECHANICALS) IMPLANT
SLEEVE Z-THREAD 5X100MM (TROCAR) IMPLANT
SPIKE FLUID TRANSFER (MISCELLANEOUS) IMPLANT
SUT VIC AB 4-0 PS2 27 (SUTURE) IMPLANT
TOWEL OR 17X26 10 PK STRL BLUE (TOWEL DISPOSABLE) ×1 IMPLANT
TRAY FOLEY MTR SLVR 16FR STAT (SET/KITS/TRAYS/PACK) IMPLANT
TRAY LAPAROSCOPIC (CUSTOM PROCEDURE TRAY) ×1 IMPLANT
TROCAR 11X100 Z THREAD (TROCAR) IMPLANT
TROCAR BALLN 12MMX100 BLUNT (TROCAR) ×1 IMPLANT
TROCAR Z-THREAD OPTICAL 5X100M (TROCAR) ×1 IMPLANT
TROCAR Z-THREAD SLEEVE 11X100 (TROCAR) IMPLANT

## 2023-04-05 NOTE — H&P (View-Only) (Signed)
[facility] Progress Note     Subjective: CC:  Ongoing  RLQ and some suprapubic abdominal pain. Denies vomiting. States that typically with UTIs she gets bladder spasms and has urinary urgency, this feels different.  Objective: Vital signs in last 24 hours: Temp:  [97.7 F (36.5 C)-98.5 F (36.9 C)] 97.7 F (36.5 C) (05/30 0220) Pulse Rate:  [70-106] 70 (05/30 0220) Resp:  [16-18] 16 (05/30 0220) BP: (95-120)/(56-82) 95/56 (05/30 0220) SpO2:  [98 %-100 %] 99 % (05/30 0220) Last BM Date :  (patient does not know)  Intake/Output from previous day: 05/29 0701 - 05/30 0700 In: 2427.5 [I.V.:1128.1; IV Piggyback:1299.5] Out: -  Intake/Output this shift: No intake/output data recorded.  PE: Gen:  Alert, NAD, appears nervous  Card:  Regular rate and rhythm, pedal pulses 2+ BL Pulm:  Normal effort, clear to auscultation bilaterally Abd: Soft, mild distention, TTP RLQ and SP region without guarding or rebound tenderness, no hernias or masses Skin: warm and dry, no rashes  Psych: A&Ox3   Lab Results:  Recent Labs    04/04/23 0735 04/05/23 0546  WBC 30.5* 24.0*  HGB 13.2 12.4  HCT 40.5 38.2  PLT 279 240   BMET Recent Labs    04/04/23 0735 04/05/23 0546  NA 137 137  K 3.9 3.6  CL 103 105  CO2 25 25  GLUCOSE 125* 110*  BUN 11 7  CREATININE 0.99 0.98  CALCIUM 9.0 8.3*   PT/INR No results for input(s): "LABPROT", "INR" in the last 72 hours. CMP     Component Value Date/Time   NA 137 04/05/2023 0546   K 3.6 04/05/2023 0546   CL 105 04/05/2023 0546   CO2 25 04/05/2023 0546   GLUCOSE 110 (H) 04/05/2023 0546   BUN 7 04/05/2023 0546   CREATININE 0.98 04/05/2023 0546   CREATININE 0.69 07/04/2019 0000   CALCIUM 8.3 (L) 04/05/2023 0546   PROT 7.7 04/04/2023 0735   ALBUMIN 3.9 04/04/2023 0735   AST 17 04/04/2023 0735   ALT 14 04/04/2023 0735   ALKPHOS 59 04/04/2023 0735   BILITOT 0.8 04/04/2023 0735   GFRNONAA >60 04/05/2023 0546   Lipase      Component Value Date/Time   LIPASE 24 05/01/2021 0240       Studies/Results: US PELVIC COMPLETE W TRANSVAGINAL AND TORSION R/O  Result Date: 04/04/2023 CLINICAL DATA:  Pelvic pain.  Same-day CT with left adnexal cyst EXAM: TRANSABDOMINAL AND TRANSVAGINAL ULTRASOUND OF PELVIS DOPPLER ULTRASOUND OF OVARIES TECHNIQUE: Both transabdominal and transvaginal ultrasound examinations of the pelvis were performed. Transabdominal technique was performed for global imaging of the pelvis including uterus, ovaries, adnexal regions, and pelvic cul-de-sac. It was necessary to proceed with endovaginal exam following the transabdominal exam to visualize the pelvic structures. Color and duplex Doppler ultrasound was utilized to evaluate blood flow to the ovaries. COMPARISON:  CT abdomen and pelvis dated 04/04/2019 FINDINGS: Uterus Measurements: 8.3 cm in sagittal dimension. No fibroids or other mass visualized. Endometrium Thickness: 13 mm.  Intrauterine device in-situ. Right ovary Measurements: 3.5 x 2.7 x 2.6 cm = volume: 12.3 mL. Normal appearance. No adnexal mass. Left ovary Measurements: 5.0 x 3.8 x 3.5 cm = volume: 34.9 mL. Contains a heterogeneously hypoechoic cyst measuring 2.6 x 2.0 x 1.4 cm with internal curvilinear echogenicities. Pulsed Doppler evaluation of both ovaries demonstrates normal low-resistance arterial and venous waveforms. Other findings Trace pelvic free fluid. IMPRESSION: 1. Left ovarian 2.6 cm hemorrhagic cyst. No specific follow-up imaging recommended. 2.   No evidence of ovarian torsion. 3. Intrauterine device in-situ. Electronically Signed   By: Limin  Xu M.D.   On: 04/04/2023 10:17   CT ABDOMEN PELVIS W CONTRAST  Result Date: 04/04/2023 CLINICAL DATA:  Lower abdominal pain, worse on right side. Elevated white blood cell count. EXAM: CT ABDOMEN AND PELVIS WITH CONTRAST TECHNIQUE: Multidetector CT imaging of the abdomen and pelvis was performed using the standard protocol following bolus  administration of intravenous contrast. RADIATION DOSE REDUCTION: This exam was performed according to the departmental dose-optimization program which includes automated exposure control, adjustment of the mA and/or kV according to patient size and/or use of iterative reconstruction technique. CONTRAST:  100mL OMNIPAQUE IOHEXOL 300 MG/ML  SOLN COMPARISON:  05/01/2021 FINDINGS: Lower chest: Lung bases are clear. Hepatobiliary: Normal appearance of the liver, gallbladder and portal venous system. No biliary dilatation. Pancreas: Unremarkable. No pancreatic ductal dilatation or surrounding inflammatory changes. Spleen: Normal in size without focal abnormality. Adrenals/Urinary Tract: Normal adrenal glands. Normal appearance of both kidneys. No hydronephrosis. No evidence for kidney stonesand no suspicious renal lesion. Mild bladder wall thickening is similar to the previous examination. Stomach/Bowel: Normal appearance of the stomach. Minimal stranding in the right lower quadrant just anterior to the cecum on image 71/2. The appendix is not clearly identified. No evidence for bowel dilatation or obstruction. Vascular/Lymphatic: No significant vascular findings are present. No enlarged abdominal or pelvic lymph nodes. Reproductive: 3.9 cm low-density structure involving the left adnexa likely represents an ovarian cyst. Limited evaluation of the right adnexa. IUD is present. Other: Trace fluid in the pelvis is likely physiologic. Negative for free air. Musculoskeletal: No acute bone abnormality. IMPRESSION: 1. Minimal stranding in the right lower quadrant just anterior to the cecum. The appendix is not clearly identified. Findings are nonspecific. 2. 3.9 cm low-density structure in the left adnexa likely represents an ovarian cyst. 3. Trace fluid in the pelvis is likely physiologic. Electronically Signed   By: Adam  Henn M.D.   On: 04/04/2023 08:54    Anti-infectives: Anti-infectives (From admission, onward)     Start     Dose/Rate Route Frequency Ordered Stop   04/05/23 1000  cefTRIAXone (ROCEPHIN) 2 g in sodium chloride 0.9 % 100 mL IVPB  Status:  Discontinued        2 g 200 mL/hr over 30 Minutes Intravenous Every 24 hours 04/04/23 1155 04/04/23 1414   04/05/23 0900  cefTRIAXone (ROCEPHIN) 2 g in sodium chloride 0.9 % 100 mL IVPB       See Hyperspace for full Linked Orders Report.   2 g 200 mL/hr over 30 Minutes Intravenous Every 24 hours 04/04/23 1414 04/12/23 0859   04/04/23 2200  metroNIDAZOLE (FLAGYL) IVPB 500 mg  Status:  Discontinued        500 mg 100 mL/hr over 60 Minutes Intravenous Every 12 hours 04/04/23 1155 04/04/23 1414   04/04/23 2200  metroNIDAZOLE (FLAGYL) IVPB 500 mg       See Hyperspace for full Linked Orders Report.   500 mg 100 mL/hr over 60 Minutes Intravenous Every 12 hours 04/04/23 1414 04/11/23 2159   04/04/23 0915  cefTRIAXone (ROCEPHIN) 2 g in sodium chloride 0.9 % 100 mL IVPB       See Hyperspace for full Linked Orders Report.   2 g 200 mL/hr over 30 Minutes Intravenous  Once 04/04/23 0902 04/04/23 1021   04/04/23 0915  metroNIDAZOLE (FLAGYL) IVPB 500 mg       See Hyperspace for full Linked Orders Report.     500 mg 100 mL/hr over 60 Minutes Intravenous  Once 04/04/23 0902 04/04/23 1122        Assessment/Plan  16 y/o F with acute onset suprapubic and RLQ pain associated with nausea  - afebrile, WBC 24 from 30 - labs improved but clinically the patient has significant RLQ pain today, not improved. Recommend diagnostic laparoscopy, possible appendectomy.   Patient states her mother is on the way. She would like to wait for her mother to arrive and discuss risks/benefits of surgery at that time, which is reasonable.   Continue IV abx, NPO   LOS: 1 day   I reviewed nursing notes, last 24 h vitals and pain scores, last 48 h intake and output, last 24 h labs and trends, and last 24 h imaging results.  This care required high  level of medical decision making.    Eragon Hammond, PA-C [facility] Please see Amion for pager number during day hours 7:00am-4:30pm       

## 2023-04-05 NOTE — Interval H&P Note (Signed)
History and Physical Interval Note:  04/05/2023 11:32 AM  Lisa Abbott  has presented today for surgery, with the diagnosis of RIGHT LOWER QUADRANT PAIN.  The various methods of treatment have been discussed with the patient and family. After consideration of risks, benefits and other options for treatment, the patient has consented to  Procedure(s): LAPAROSCOPY DIAGNOSTIC (N/A) as a surgical intervention.  The patient's history has been reviewed, patient examined, no change in status, stable for surgery.  I have reviewed the patient's chart and labs.  Questions were answered to the patient's satisfaction.     The procedure has been discussed with the patient.  Alternative therapies have been discussed with the patient.  Operative risks include bleeding,  Infection,  Organ injury,  Nerve injury,  Blood vessel injury,  DVT,  Pulmonary embolism,  Death,  And possible reoperation.  Medical management risks include worsening of present situation.  The success of the procedure is 50 -90 % at treating patients symptoms.  The patient understands and agrees to proceed.    Victorious Cosio A Ziare Orrick

## 2023-04-05 NOTE — Anesthesia Procedure Notes (Signed)
Procedure Name: Intubation Date/Time: 04/05/2023 12:59 PM  Performed by: Pearson Grippe, CRNAPre-anesthesia Checklist: Patient identified, Emergency Drugs available, Suction available and Patient being monitored Patient Re-evaluated:Patient Re-evaluated prior to induction Oxygen Delivery Method: Circle system utilized Preoxygenation: Pre-oxygenation with 100% oxygen Induction Type: IV induction Ventilation: Mask ventilation without difficulty Laryngoscope Size: Miller and 2 Grade View: Grade I Tube type: Oral Tube size: 7.0 mm Number of attempts: 1 Airway Equipment and Method: Stylet and Oral airway Placement Confirmation: ETT inserted through vocal cords under direct vision, positive ETCO2 and breath sounds checked- equal and bilateral Secured at: 21 cm Tube secured with: Tape Dental Injury: Teeth and Oropharynx as per pre-operative assessment

## 2023-04-05 NOTE — Transfer of Care (Signed)
Immediate Anesthesia Transfer of Care Note  Patient: Lisa Abbott  Procedure(s) Performed: LAPAROSCOPY DIAGNOSTIC, Laparoscopic Appendectomy  Patient Location: PACU  Anesthesia Type:General  Level of Consciousness: awake, alert , and oriented  Airway & Oxygen Therapy: Patient Spontanous Breathing and Patient connected to face mask oxygen  Post-op Assessment: Report given to RN and Post -op Vital signs reviewed and stable  Post vital signs: Reviewed and stable  Last Vitals:  Vitals Value Taken Time  BP 122/73 04/05/23 1410  Temp 36.4 C 04/05/23 1410  Pulse 94 04/05/23 1412  Resp 17 04/05/23 1412  SpO2 100 % 04/05/23 1412  Vitals shown include unvalidated device data.  Last Pain:  Vitals:   04/05/23 1115  TempSrc: Oral  PainSc:          Complications: No notable events documented.

## 2023-04-05 NOTE — Anesthesia Preprocedure Evaluation (Addendum)
Anesthesia Evaluation  Patient identified by MRN, date of birth, ID band Patient awake    Reviewed: Allergy & Precautions, NPO status , Patient's Chart, lab work & pertinent test results  History of Anesthesia Complications Negative for: history of anesthetic complications  Airway Mallampati: II  TM Distance: >3 FB Neck ROM: Full    Dental no notable dental hx. (+) Dental Advisory Given   Pulmonary neg pulmonary ROS   Pulmonary exam normal        Cardiovascular negative cardio ROS Normal cardiovascular exam     Neuro/Psych  PSYCHIATRIC DISORDERS Anxiety Depression    negative neurological ROS     GI/Hepatic negative GI ROS, Neg liver ROS,,,  Endo/Other  negative endocrine ROS    Renal/GU negative Renal ROS     Musculoskeletal negative musculoskeletal ROS (+)    Abdominal   Peds  Hematology negative hematology ROS (+)   Anesthesia Other Findings   Reproductive/Obstetrics                             Anesthesia Physical Anesthesia Plan  ASA: 2  Anesthesia Plan: General   Post-op Pain Management: Tylenol PO (pre-op)*, Toradol IV (intra-op)* and Lidocaine infusion*   Induction: Intravenous  PONV Risk Score and Plan: 4 or greater and Dexamethasone, Midazolam and Scopolamine patch - Pre-op  Airway Management Planned: Oral ETT  Additional Equipment:   Intra-op Plan:   Post-operative Plan: Extubation in OR  Informed Consent: I have reviewed the patients History and Physical, chart, labs and discussed the procedure including the risks, benefits and alternatives for the proposed anesthesia with the patient or authorized representative who has indicated his/her understanding and acceptance.     Dental advisory given  Plan Discussed with: Anesthesiologist and CRNA  Anesthesia Plan Comments:        Anesthesia Quick Evaluation

## 2023-04-05 NOTE — Op Note (Signed)
Preoperative diagnosis: Abdominal pain right lower quadrant  Postoperative diagnosis: Same with evidence of hemoperitoneum secondary from ruptured ovarian cyst  Procedure: Diagnostic laparoscopy with laparoscopic appendectomy  Surgeon: Harriette Bouillon, MD  Anesthesia: General with 0.25% Marcaine plain  EBL: 30 cc  Drains: None  Specimen: Appendix to pathology  Indications for procedure: The patient is a pleasant 17 year old female with a 2-day history of severe lower abdominal pain that started 2 days ago.  She was seen yesterday at the [facility] and a CT scan was nondiagnostic.  I reviewed this there appeared to be some pelvic inflammatory changes that are noted.  She had a white count of 30,000 as well.  She was admitted overnight placed on antibiotics.  Her white count came down to about 24,000 but she continued to have significant pain requiring more narcotic control.  I recommended laparoscopy due to increasing abdominal pain.  I discussed this with the patient as well as her mom today in the preop holding area.  Discussed potential findings and potential interventions depending on those findings but I felt laparoscopy is the next appropriate step.The procedure has been discussed with the patient.  Alternative therapies have been discussed with the patient.  Operative risks include bleeding,  Infection,  Organ injury,  Nerve injury,  Blood vessel injury,  DVT,  Pulmonary embolism,  Death,  And possible reoperation.  Medical management risks include worsening of present situation.  The success of the procedure is 50 -90 % at treating patients symptoms.  The patient understands and agrees to proceed.     Description of procedure: The patient was taken from the holding area to the operating room.  She is placed supine upon the operating table.  After induction of general anesthesia, Foley catheter was placed under sterile conditions.  The abdomen was then prepped and draped in  sterile fashion timeout performed.  Proper patient, site and procedure verified.  A 1 cm supraumbilical incision was made.  Dissection was carried down the midline fascia identified.  A scalpel was used to open the fascia under direct vision.  A finger was placed and this was in the preperitoneal space.  A small Kelly clamp was used in the peritoneal lining was opened.  A pursestring suture of 0 Vicryl was placed at 12 mm Hassan cannula was placed under direct vision.  Laparoscopy performed.  Upon insertion scope there was a moderate amount of old blood in the pelvis and up the right colic gutter.  We placed a 5 mm port in the right upper quadrant and another in the left lower quadrant.  The bladder was decompressed the Foley.  I examined the pelvic organs.  The uterus appeared grossly normal.  The right ovary appeared normal.  Left ovary had a large cyst on it with signs of what look like rupture.  This was noted on previous CT scan but appeared smaller with laparoscopy today.  There was blood in the pelvis and up the gutter.  The gallbladder was identified and was normal.  The liver was normal.  The stomach was normal.  The small bowel was run from the ileocecal valve back and I saw no signs of Crohn's or a Meckel's not diverticulum.  The ascending colon, transverse colon, descending colon and sigmoid colon as well as rectum were all grossly normal.  The appendix was visualized and was of normal caliber.  The explanation for pain that I could find was a hemoperitoneum from the ruptured ovarian cyst.  I saw no signs of infection or a pelvic collection worrisome for infection or PID.  I felt to be best to be the appendix since she was having right lower quadrant pain and certainly this would be difficult to differentiate in the future.  The appendix was grasped.  Mesoappendix taken down with the harmonic scalpel.  The base of the cecum and appendix were identified.  A GIA 45 stapler with a blue load was fired across  the base of the cecum.  The appendix was placed into an Endo Catch bag and extracted.  The base was intact as well as the staple line.  No signs of bleeding or leakage.  I reexamined all 4 quadrants of the abdomen and saw no other pathology to address nor to explain her pain.  Irrigation was used and the old blood was suctioned out.  There is no ongoing bleeding or new bleeding.  Our ports were then removed.  Umbilical port site closed with 0 Vicryl.  4 Monocryl used to close the skin.  All counts found to be correct.  The patient was awoke extubated taken to recovery in satisfactory condition.

## 2023-04-05 NOTE — Anesthesia Postprocedure Evaluation (Signed)
Anesthesia Post Note  Patient: Lisa Abbott  Procedure(s) Performed: LAPAROSCOPY DIAGNOSTIC, Laparoscopic Appendectomy     Patient location during evaluation: PACU Anesthesia Type: General Level of consciousness: sedated Pain management: pain level controlled Vital Signs Assessment: post-procedure vital signs reviewed and stable Respiratory status: spontaneous breathing and respiratory function stable Cardiovascular status: stable Postop Assessment: no apparent nausea or vomiting Anesthetic complications: no  No notable events documented.  Last Vitals:  Vitals:   04/05/23 1430 04/05/23 1445  BP: 129/78 119/73  Pulse: 89 87  Resp: 16 15  Temp:  36.4 C  SpO2: 96% 95%    Last Pain:  Vitals:   04/05/23 1445  TempSrc:   PainSc: 5                  Jerrett Baldinger DANIEL

## 2023-04-05 NOTE — Progress Notes (Signed)
[facility] Progress Note     Subjective: CC:  Ongoing  RLQ and some suprapubic abdominal pain. Denies vomiting. States that typically with UTIs she gets bladder spasms and has urinary urgency, this feels different.  Objective: Vital signs in last 24 hours: Temp:  [97.7 F (36.5 C)-98.5 F (36.9 C)] 97.7 F (36.5 C) (05/30 0220) Pulse Rate:  [70-106] 70 (05/30 0220) Resp:  [16-18] 16 (05/30 0220) BP: (95-120)/(56-82) 95/56 (05/30 0220) SpO2:  [98 %-100 %] 99 % (05/30 0220) Last BM Date :  (patient does not know)  Intake/Output from previous day: 05/29 0701 - 05/30 0700 In: 2427.5 [I.V.:1128.1; IV Piggyback:1299.5] Out: -  Intake/Output this shift: No intake/output data recorded.  PE: Gen:  Alert, NAD, appears nervous  Card:  Regular rate and rhythm, pedal pulses 2+ BL Pulm:  Normal effort, clear to auscultation bilaterally Abd: Soft, mild distention, TTP RLQ and SP region without guarding or rebound tenderness, no hernias or masses Skin: warm and dry, no rashes  Psych: A&Ox3   Lab Results:  Recent Labs    04/04/23 0735 04/05/23 0546  WBC 30.5* 24.0*  HGB 13.2 12.4  HCT 40.5 38.2  PLT 279 240   BMET Recent Labs    04/04/23 0735 04/05/23 0546  NA 137 137  K 3.9 3.6  CL 103 105  CO2 25 25  GLUCOSE 125* 110*  BUN 11 7  CREATININE 0.99 0.98  CALCIUM 9.0 8.3*   PT/INR No results for input(s): "LABPROT", "INR" in the last 72 hours. CMP     Component Value Date/Time   NA 137 04/05/2023 0546   K 3.6 04/05/2023 0546   CL 105 04/05/2023 0546   CO2 25 04/05/2023 0546   GLUCOSE 110 (H) 04/05/2023 0546   BUN 7 04/05/2023 0546   CREATININE 0.98 04/05/2023 0546   CREATININE 0.69 07/04/2019 0000   CALCIUM 8.3 (L) 04/05/2023 0546   PROT 7.7 04/04/2023 0735   ALBUMIN 3.9 04/04/2023 0735   AST 17 04/04/2023 0735   ALT 14 04/04/2023 0735   ALKPHOS 59 04/04/2023 0735   BILITOT 0.8 04/04/2023 0735   GFRNONAA >60 04/05/2023 0546   Lipase      Component Value Date/Time   LIPASE 24 05/01/2021 0240       Studies/Results: US PELVIC COMPLETE W TRANSVAGINAL AND TORSION R/O  Result Date: 04/04/2023 CLINICAL DATA:  Pelvic pain.  Same-day CT with left adnexal cyst EXAM: TRANSABDOMINAL AND TRANSVAGINAL ULTRASOUND OF PELVIS DOPPLER ULTRASOUND OF OVARIES TECHNIQUE: Both transabdominal and transvaginal ultrasound examinations of the pelvis were performed. Transabdominal technique was performed for global imaging of the pelvis including uterus, ovaries, adnexal regions, and pelvic cul-de-sac. It was necessary to proceed with endovaginal exam following the transabdominal exam to visualize the pelvic structures. Color and duplex Doppler ultrasound was utilized to evaluate blood flow to the ovaries. COMPARISON:  CT abdomen and pelvis dated 04/04/2019 FINDINGS: Uterus Measurements: 8.3 cm in sagittal dimension. No fibroids or other mass visualized. Endometrium Thickness: 13 mm.  Intrauterine device in-situ. Right ovary Measurements: 3.5 x 2.7 x 2.6 cm = volume: 12.3 mL. Normal appearance. No adnexal mass. Left ovary Measurements: 5.0 x 3.8 x 3.5 cm = volume: 34.9 mL. Contains a heterogeneously hypoechoic cyst measuring 2.6 x 2.0 x 1.4 cm with internal curvilinear echogenicities. Pulsed Doppler evaluation of both ovaries demonstrates normal low-resistance arterial and venous waveforms. Other findings Trace pelvic free fluid. IMPRESSION: 1. Left ovarian 2.6 cm hemorrhagic cyst. No specific follow-up imaging recommended. 2.  No evidence of ovarian torsion. 3. Intrauterine device in-situ. Electronically Signed   By: Agustin Cree M.D.   On: 04/04/2023 10:17   CT ABDOMEN PELVIS W CONTRAST  Result Date: 04/04/2023 CLINICAL DATA:  Lower abdominal pain, worse on right side. Elevated white blood cell count. EXAM: CT ABDOMEN AND PELVIS WITH CONTRAST TECHNIQUE: Multidetector CT imaging of the abdomen and pelvis was performed using the standard protocol following bolus  administration of intravenous contrast. RADIATION DOSE REDUCTION: This exam was performed according to the departmental dose-optimization program which includes automated exposure control, adjustment of the mA and/or kV according to patient size and/or use of iterative reconstruction technique. CONTRAST:  OMNIPAQUE IOHEXOL 300 MG/ML  SOLN COMPARISON:  05/01/2021 FINDINGS: Lower chest: Lung bases are clear. Hepatobiliary: Normal appearance of the liver, gallbladder and portal venous system. No biliary dilatation. Pancreas: Unremarkable. No pancreatic ductal dilatation or surrounding inflammatory changes. Spleen: Normal in size without focal abnormality. Adrenals/Urinary Tract: Normal adrenal glands. Normal appearance of both kidneys. No hydronephrosis. No evidence for kidney stonesand no suspicious renal lesion. Mild bladder wall thickening is similar to the previous examination. Stomach/Bowel: Normal appearance of the stomach. Minimal stranding in the right lower quadrant just anterior to the cecum on image 71/2. The appendix is not clearly identified. No evidence for bowel dilatation or obstruction. Vascular/Lymphatic: No significant vascular findings are present. No enlarged abdominal or pelvic lymph nodes. Reproductive: 3.9 cm low-density structure involving the left adnexa likely represents an ovarian cyst. Limited evaluation of the right adnexa. IUD is present. Other: Trace fluid in the pelvis is likely physiologic. Negative for free air. Musculoskeletal: No acute bone abnormality. IMPRESSION: 1. Minimal stranding in the right lower quadrant just anterior to the cecum. The appendix is not clearly identified. Findings are nonspecific. 2. 3.9 cm low-density structure in the left adnexa likely represents an ovarian cyst. 3. Trace fluid in the pelvis is likely physiologic. Electronically Signed   By: Richarda Overlie M.D.   On: 04/04/2023 08:54    Anti-infectives: Anti-infectives (From admission, onward)     Start     Dose/Rate Route Frequency Ordered Stop   04/05/23 1000  cefTRIAXone (ROCEPHIN) 2 g in sodium chloride 0.9 % 100 mL IVPB  Status:  Discontinued        2 g 200 mL/hr over 30 Minutes Intravenous Every 24 hours 04/04/23 1155 04/04/23 1414   04/05/23 0900  cefTRIAXone (ROCEPHIN) 2 g in sodium chloride 0.9 % 100 mL IVPB       See Hyperspace for full Linked Orders Report.   2 g 200 mL/hr over 30 Minutes Intravenous Every 24 hours 04/04/23 1414 04/12/23 0859   04/04/23 2200  metroNIDAZOLE (FLAGYL) IVPB 500 mg  Status:  Discontinued        500 mg 100 mL/hr over 60 Minutes Intravenous Every 12 hours 04/04/23 1155 04/04/23 1414   04/04/23 2200  metroNIDAZOLE (FLAGYL) IVPB 500 mg       See Hyperspace for full Linked Orders Report.   500 mg 100 mL/hr over 60 Minutes Intravenous Every 12 hours 04/04/23 1414 04/11/23 2159   04/04/23 0915  cefTRIAXone (ROCEPHIN) 2 g in sodium chloride 0.9 % 100 mL IVPB       See Hyperspace for full Linked Orders Report.   2 g 200 mL/hr over 30 Minutes Intravenous  Once 04/04/23 0902 04/04/23 1021   04/04/23 0915  metroNIDAZOLE (FLAGYL) IVPB 500 mg       See Hyperspace for full Linked Orders Report.  500 mg 100 mL/hr over 60 Minutes Intravenous  Once 04/04/23 0902 04/04/23 1122        Assessment/Plan  17 y/o F with acute onset suprapubic and RLQ pain associated with nausea  - afebrile, WBC 24 from 30 - labs improved but clinically the patient has significant RLQ pain today, not improved. Recommend diagnostic laparoscopy, possible appendectomy.   Patient states her mother is on the way. She would like to wait for her mother to arrive and discuss risks/benefits of surgery at that time, which is reasonable.   Continue IV abx, NPO   LOS: 1 day   I reviewed nursing notes, last 24 h vitals and pain scores, last 48 h intake and output, last 24 h labs and trends, and last 24 h imaging results.  This care required high  level of medical decision making.    Hosie Spangle, PA-C [facility] Please see Amion for pager number during day hours 7:00am-4:30pm

## 2023-04-06 ENCOUNTER — Encounter (HOSPITAL_COMMUNITY): Payer: Self-pay | Admitting: Surgery

## 2023-04-06 LAB — SURGICAL PATHOLOGY

## 2023-04-06 LAB — CULTURE, BLOOD (ROUTINE X 2): Culture: NO GROWTH

## 2023-04-06 MED ORDER — IBUPROFEN 800 MG PO TABS
800.0000 mg | ORAL_TABLET | Freq: Three times a day (TID) | ORAL | 0 refills | Status: DC | PRN
Start: 2023-04-06 — End: 2023-04-30

## 2023-04-06 MED ORDER — METHOCARBAMOL 750 MG PO TABS: 375.0000 mg | ORAL_TABLET | Freq: Three times a day (TID) | ORAL | 0 refills | Status: DC | PRN

## 2023-04-06 MED ORDER — SULFAMETHOXAZOLE-TRIMETHOPRIM 800-160 MG PO TABS: 1.0000 | ORAL_TABLET | Freq: Two times a day (BID) | ORAL | 0 refills | Status: AC

## 2023-04-06 MED ORDER — OXYCODONE HCL 5 MG PO TABS
5.0000 mg | ORAL_TABLET | Freq: Four times a day (QID) | ORAL | 0 refills | Status: DC | PRN
Start: 2023-04-06 — End: 2023-04-30

## 2023-04-06 NOTE — Discharge Instructions (Signed)
[facility] ______[facility] LAPAROSCOPIC SURGERY: POST OP INSTRUCTIONS Always review your discharge instruction sheet given to you by the facility where your surgery was performed. IF YOU HAVE DISABILITY OR FAMILY LEAVE FORMS, YOU MUST BRING THEM TO THE OFFICE FOR PROCESSING.   DO NOT GIVE THEM TO YOUR DOCTOR.  A prescription for pain medication may be given to you upon discharge.  Take your pain medication as prescribed, if needed.  If narcotic pain medicine is not needed, then you may take acetaminophen (Tylenol) or ibuprofen (Advil) as needed. Take your usually prescribed medications unless otherwise directed. If you need a refill on your pain medication, please contact your pharmacy.  They will contact our office to request authorization. Prescriptions will not be filled after 5pm or on week-ends. You should follow a light diet the first few days after arrival home, such as soup and crackers, etc.  Be sure to include lots of fluids daily. Most patients will experience some swelling and bruising in the area of the incisions.  Ice packs will help.  Swelling and bruising can take several days to resolve.  It is common to experience some constipation if taking pain medication after surgery.  Increasing fluid intake and taking a stool softener (such as Colace) will usually help or prevent this problem from occurring.  A mild laxative (Milk of Magnesia or Miralax) should be taken according to package instructions if there are no bowel movements after 48 hours. Unless discharge instructions indicate otherwise, you may remove your bandages 24-48 hours after surgery, and you may shower at that time.  You may have steri-strips (small skin tapes) in place directly over the incision.  These strips should be left on the skin for 7-10 days.  If your surgeon used skin glue on the incision, you may shower in 24 hours.  The glue will flake off over the next 2-3 weeks.  Any sutures or staples will be  removed at the office during your follow-up visit. ACTIVITIES:  You may resume regular (light) daily activities beginning the next day--such as daily self-care, walking, climbing stairs--gradually increasing activities as tolerated.  You may have sexual intercourse when it is comfortable.  Refrain from any heavy lifting or straining until approved by your doctor. You may drive when you are no longer taking prescription pain medication, you can comfortably wear a seatbelt, and you can safely maneuver your car and apply brakes. RETURN TO WORK:  __________________________________________________________ You should see your doctor in the office for a follow-up appointment approximately 2-3 weeks after your surgery.  Make sure that you call for this appointment within a day or two after you arrive home to insure a convenient appointment time. OTHER INSTRUCTIONS: __________________________________________________________________________________________________________________________ __________________________________________________________________________________________________________________________ WHEN TO CALL YOUR DOCTOR: Fever over 101.0 Inability to urinate Continued bleeding from incision. Increased pain, redness, or drainage from the incision. Increasing abdominal pain  The clinic staff is available to answer your questions during regular business hours.  Please don't hesitate to call and ask to speak to one of the nurses for clinical concerns.  If you have a medical emergency, go to the nearest emergency room or call 911.  A surgeon from [facility] is always on call at the hospital. [address], [address], [place], [place]  [address] ? [address] [address], [place], [place]   [address] [phone] ? [phone] ? FAX [phone] Web site: [email or web address]  

## 2023-04-06 NOTE — Discharge Summary (Signed)
Physician Discharge Summary  Patient ID: Lisa Abbott MRN: [id number] DOB/AGE: 06-27-2006 17 y.o.  Admit date: 04/04/2023 Discharge date: 04/06/2023  Admission Diagnoses: Abdominal pain  Discharge Diagnoses: Ruptured hemorrhagic ovarian cyst   Discharged Condition: good  Hospital Course: Patient admitted for abdominal pain.  Workup was otherwise unremarkable except for some potential pelvic stranding.  She did not improve on initial antibiotic therapy.  She was admitted with a white count of 30,000 and this came down to about 24,000.  She was taken for diagnostic laparoscopy which revealed blood in the pelvis consistent with ruptured ovarian cyst.  The remainder of the laparoscopy was normal.  Appendectomy was performed to prevent confusion in the future.  Postop day 1 she was much improved.  Her pain was much better and she appeared much more comfortable.  She was tolerating her diet and was afebrile.  She has a history of chronic UTIs.  Her initially urine was inconclusive.  She was ambulating with good pain control and just some occasional soreness.  She was discharged home on postop day 1 in satisfactory condition.  Plan will be for Bactrim for 5 more days to cover her chronic urinary issues.  Of note she had a positive clue test with workup in the ED but had no signs of any PID on laparoscopy.  Consults: None    Treatments: surgery: laparoscopy diagnostic appendectomy   Discharge Exam: Blood pressure 118/73, pulse 73, temperature 97.8 F (36.6 C), temperature source Oral, resp. rate 17, height 5\' 6"  (1.676 m), weight 72.6 kg, last menstrual period 04/02/2023, SpO2 100 %. General appearance: alert and cooperative Resp: clear to auscultation bilaterally Cardio: NSR Incision/Wound: Sore around port sites.  Port sites clean dry intact.  Nondistended.  No signs of peritonitis.  Disposition: Discharge disposition: 01-Home or Self Care       Discharge Instructions     Diet - low  sodium heart healthy   Complete by: As directed    Discharge instructions   Complete by: As directed    Office will call to set up follow up appt in 10 -14 days   Driving Restrictions   Complete by: As directed    Drive in 5 days   Increase activity slowly   Complete by: As directed    Lifting restrictions   Complete by: As directed    No lifting more than 10 lbs for 10 days      Allergies as of 04/06/2023   No Known Allergies      Medication List     TAKE these medications    ibuprofen 800 MG tablet Commonly known as: ADVIL Take 1 tablet (800 mg total) by mouth every 8 (eight) hours as needed.   methocarbamol 750 MG tablet Commonly known as: Robaxin-750 Take 0.5 tablets (375 mg total) by mouth every 8 (eight) hours as needed for muscle spasms.   metroNIDAZOLE 500 MG tablet Commonly known as: FLAGYL Take 1 tablet (500 mg total) by mouth 2 (two) times daily.   oxyCODONE 5 MG immediate release tablet Commonly known as: Oxy IR/ROXICODONE Take 1 tablet (5 mg total) by mouth every 6 (six) hours as needed for severe pain.   sulfamethoxazole-trimethoprim 800-160 MG tablet Commonly known as: BACTRIM DS Take 1 tablet by mouth 2 (two) times daily for 7 days.   venlafaxine XR 37.5 MG 24 hr capsule Commonly known as: Effexor XR Take 1 capsule (37.5 mg total) by mouth daily with breakfast.  Signed: Dortha Schwalbe MD  04/06/2023, 8:40 AM

## 2023-04-07 DIAGNOSIS — Z419 Encounter for procedure for purposes other than remedying health state, unspecified: Secondary | ICD-10-CM | POA: Diagnosis not present

## 2023-04-08 LAB — CULTURE, BLOOD (ROUTINE X 2): Culture: NO GROWTH

## 2023-04-09 DIAGNOSIS — R110 Nausea: Secondary | ICD-10-CM | POA: Diagnosis not present

## 2023-04-09 LAB — CULTURE, BLOOD (ROUTINE X 2)

## 2023-04-13 ENCOUNTER — Other Ambulatory Visit: Payer: Self-pay | Admitting: Surgery

## 2023-04-19 ENCOUNTER — Ambulatory Visit: Payer: Medicaid Other | Admitting: Obstetrics and Gynecology

## 2023-04-30 ENCOUNTER — Encounter: Payer: Self-pay | Admitting: Advanced Practice Midwife

## 2023-04-30 ENCOUNTER — Ambulatory Visit (INDEPENDENT_AMBULATORY_CARE_PROVIDER_SITE_OTHER): Payer: Medicaid Other | Admitting: Advanced Practice Midwife

## 2023-04-30 VITALS — BP 100/68 | HR 92 | Ht 67.0 in | Wt 162.0 lb

## 2023-04-30 DIAGNOSIS — Z30011 Encounter for initial prescription of contraceptive pills: Secondary | ICD-10-CM

## 2023-04-30 DIAGNOSIS — Z30432 Encounter for removal of intrauterine contraceptive device: Secondary | ICD-10-CM

## 2023-04-30 MED ORDER — NORETHIN ACE-ETH ESTRAD-FE 1-20 MG-MCG PO TABS
1.0000 | ORAL_TABLET | Freq: Every day | ORAL | 11 refills | Status: DC
Start: 2023-04-30 — End: 2024-07-12

## 2023-04-30 NOTE — Progress Notes (Signed)
[facility]  GYNECOLOGY OFFICE PROCEDURE NOTE  Lisa Abbott is a 17 y.o. G0P0000 here for IUD removal. The patient currently has a Mirena IUD placed on 01/06/22. She had ruptured hemorrhagic ovarian cyst recently. Her provider recommended removal of the IUD as it may help prevent future cysts. Appendectomy was performed at time of laparoscopic procedure "to prevent confusion in the future" related to abdominal pain. She has also had irregular bleeding since insertion of the IUD and she requests removal of the IUD.   We discussed other birth control options and she is most interested in the pill. She admits to light vaping and may consider quitting. She was informed of the risk of blood clots when combining nicotine use with exogenous estrogen. She was encouraged to quit vaping.   Review of Systems  Constitutional:  Negative for chills and fever.  HENT:  Negative for congestion, ear discharge, ear pain, hearing loss, sinus pain and sore throat.   Eyes:  Negative for blurred vision and double vision.  Respiratory:  Negative for cough, shortness of breath and wheezing.   Cardiovascular:  Negative for chest pain, palpitations and leg swelling.  Gastrointestinal:  Negative for abdominal pain, blood in stool, constipation, diarrhea, heartburn, melena, nausea and vomiting.  Genitourinary:  Negative for dysuria, flank pain, frequency, hematuria and urgency.  Musculoskeletal:  Negative for back pain, joint pain and myalgias.  Skin:  Negative for itching and rash.  Neurological:  Negative for dizziness, tingling, tremors, sensory change, speech change, focal weakness, seizures, loss of consciousness, weakness and headaches.  Endo/Heme/Allergies:  Negative for environmental allergies. Does not bruise/bleed easily.  Psychiatric/Behavioral:  Negative for depression, hallucinations, memory loss, substance abuse and suicidal ideas. The patient is not nervous/anxious and does not have insomnia.    Vital  Signs: BP 100/68   Pulse 92   Ht 5\' 7"  (1.702 m)   Wt 162 lb (73.5 kg)   LMP 04/02/2023 (Approximate) Comment: Urine preg 04/04/23  BMI 25.37 kg/m  Constitutional: Well nourished, well developed female.  HEENT: normal Skin: Warm and dry.   Extremity:  no edema   Respiratory:  Normal respiratory effort Psych: Alert and Oriented x3. No memory deficits. Nervous.    Pelvic exam: (female chaperone present) is not limited by body habitus EGBUS: within normal limits Vagina: within normal limits and with normal mucosa, blood in the vault Cervix: normal appearance   IUD Removal  Patient identified, informed consent performed, consent signed. Time out was performed. Speculum placed in the vagina. The strings of the IUD were grasped and pulled using ring forceps. The IUD was successfully removed in its entirety. Patient tolerated procedure well.    Lisa Abbott, CNM [facility] 04/30/2023 4:48 PM   IUD insertion CPT 58300,  Lisa Abbott [id number] Mirena [id number] Lisa Abbott [id number] Lisa Abbott [id number] Lisa Abbott [id number] IUD remval 62130 Modifer 25, plus Modifer 79 is done during a global billing visit

## 2023-05-03 ENCOUNTER — Encounter: Payer: Self-pay | Admitting: Obstetrics and Gynecology

## 2023-05-03 ENCOUNTER — Ambulatory Visit (INDEPENDENT_AMBULATORY_CARE_PROVIDER_SITE_OTHER): Payer: Medicaid Other | Admitting: Obstetrics and Gynecology

## 2023-05-03 VITALS — BP 106/71 | HR 65 | Ht 66.0 in | Wt 167.1 lb

## 2023-05-03 DIAGNOSIS — Z01419 Encounter for gynecological examination (general) (routine) without abnormal findings: Secondary | ICD-10-CM

## 2023-05-03 DIAGNOSIS — Z30431 Encounter for routine checking of intrauterine contraceptive device: Secondary | ICD-10-CM

## 2023-05-03 DIAGNOSIS — N926 Irregular menstruation, unspecified: Secondary | ICD-10-CM

## 2023-05-03 NOTE — Progress Notes (Signed)
HPI:      [name] is a 17 y.o. G0P0000 who LMP was Patient's last menstrual period was 04/02/2023 (approximate).  Subjective:   She presents today for her annual examination.  She recently had a ruptured ovarian cyst and had "exploratory laparoscopic surgery "for it.  They told her it was caused by her IUD and recommended she have it removed.  She had her IUD removed on Monday and is now currently bleeding.  She was given a prescription for OCPs but has not yet started them.  She plans to start them as soon as she gets a chance to pick them up.    Hx: The following portions of the patient's history were reviewed and updated as appropriate:             She  has a past medical history of History of recurrent UTIs and Pyelonephritis. She does not have any pertinent problems on file. She  has a past surgical history that includes No past surgeries; laparoscopy (N/A, 04/05/2023); and Appendectomy. Her family history includes Diabetes in her maternal grandfather; Healthy in her mother; Stroke in her maternal great-grandmother. She  reports that she has been smoking e-cigarettes. She has never used smokeless tobacco. She reports current alcohol use. She reports current drug use. Frequency: 7.00 times per week. Drug: Marijuana. She has a current medication list which includes the following prescription(s): norethindrone-ethinyl estradiol-fe. She has No Known Allergies.       Review of Systems:  Review of Systems  Constitutional: Denied constitutional symptoms, night sweats, recent illness, fatigue, fever, insomnia and weight loss.  Eyes: Denied eye symptoms, eye pain, photophobia, vision change and visual disturbance.  Ears/Nose/Throat/Neck: Denied ear, nose, throat or neck symptoms, hearing loss, nasal discharge, sinus congestion and sore throat.  Cardiovascular: Denied cardiovascular symptoms, arrhythmia, chest pain/pressure, edema, exercise intolerance, orthopnea and palpitations.   Respiratory: Denied pulmonary symptoms, asthma, pleuritic pain, productive sputum, cough, dyspnea and wheezing.  Gastrointestinal: Denied, gastro-esophageal reflux, melena, nausea and vomiting.  Genitourinary: Denied genitourinary symptoms including symptomatic vaginal discharge, pelvic relaxation issues, and urinary complaints.  Musculoskeletal: Denied musculoskeletal symptoms, stiffness, swelling, muscle weakness and myalgia.  Dermatologic: Denied dermatology symptoms, rash and scar.  Neurologic: Denied neurology symptoms, dizziness, headache, neck pain and syncope.  Psychiatric: Denied psychiatric symptoms, anxiety and depression.  Endocrine: Denied endocrine symptoms including hot flashes and night sweats.   Meds:   Current Outpatient Medications on File Prior to Visit  Medication Sig Dispense Refill   norethindrone-ethinyl estradiol-FE (JUNEL FE 1/20) 1-20 MG-MCG tablet Take 1 tablet by mouth daily. (Patient not taking: Reported on 05/03/2023) 28 tablet 11   No current facility-administered medications on file prior to visit.     Objective:     Vitals:   05/03/23 0813  BP: 106/71  Pulse: 65    Filed Weights   05/03/23 0813  Weight: 167 lb 1.6 oz (75.8 kg)              Physical examination General NAD, Conversant  HEENT Atraumatic; Op clear with mmm.  Normo-cephalic. Pupils reactive. Anicteric sclerae  Thyroid/Neck Smooth without nodularity or enlargement. Normal ROM.  Neck Supple.  Skin No rashes, lesions or ulceration. Normal palpated skin turgor. No nodularity.  Breasts: No masses or discharge.  Symmetric.  No axillary adenopathy.  Lungs: Clear to auscultation.No rales or wheezes. Normal Respiratory effort, no retractions.  Heart: NSR.  No murmurs or rubs appreciated. No peripheral edema  Abdomen: Soft.  Non-tender.  No  masses.  No HSM. No hernia laparoscopic scars healing well  Extremities: Moves all appropriately.  Normal ROM for age. No lymphadenopathy.  Neuro:  Oriented to PPT.  Normal mood. Normal affect.     Pelvic: Patient declined today     Assessment:    G0P0000 Patient Active Problem List   Diagnosis Date Noted   Cecitis 04/04/2023   Abdominal pain 04/04/2023   Change in facial mole 10/17/2022   Marijuana abuse, continuous 10/17/2022   Severe episode of recurrent major depressive disorder, without psychotic features (HCC) 10/16/2022   Anxiety 10/16/2022   Irregular menses 05/12/2021   History of pyelonephritis 05/12/2021   Menorrhagia 07/29/2019   Migraine without aura and without status migrainosus, not intractable 07/28/2019   Episodic tension-type headache, not intractable 07/28/2019   Poor sleep hygiene 07/28/2019   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 Well woman exam with routine gynecological exam   2. Irregular bleeding     Normal exam   Plan:            1.  Basic Screening Recommendations The basic screening recommendations for asymptomatic women were discussed with the patient during her visit.  The age-appropriate recommendations were discussed with her and the rational for the tests reviewed.  When I am informed by the patient that another primary care physician has previously obtained the age-appropriate tests and they are up-to-date, only outstanding tests are ordered and referrals given as necessary.  Abnormal results of tests will be discussed with her when all of her results are completed.  Routine preventative health maintenance measures emphasized: Exercise/Diet/Weight control, Tobacco Warnings, Alcohol/Substance use risks and Stress Management 2.  Multiple differing opinions regarding IUD, birth control, OCPs etc. have created a confusing picture for this patient. We have talked through much of this.  She will start OCPs as soon as she can pick them up.  She is looking forward to having a regular monthly cycle that she can count on. Orders No orders of the defined types were placed in this encounter.   No orders  of the defined types were placed in this encounter.         F/U  Return in about 1 year (around 05/02/2024) for Annual Physical.  Elonda Husky, M.D. 05/03/2023 8:43 AM

## 2023-05-03 NOTE — Progress Notes (Signed)
Patients presents for annual exam today. She reports having her IUD removed 04/30/23 due to a recommendation from her previous doctor due to an ovarian cyst. Patient has been prescribed OCP's but has not started them. She reports moderate bleeding over the last month since exploratory surgery and states it has been heavier since her IUD removal.  States no other questions or concerns at this time.

## 2023-05-07 DIAGNOSIS — Z419 Encounter for procedure for purposes other than remedying health state, unspecified: Secondary | ICD-10-CM | POA: Diagnosis not present

## 2023-06-07 DIAGNOSIS — Z419 Encounter for procedure for purposes other than remedying health state, unspecified: Secondary | ICD-10-CM | POA: Diagnosis not present

## 2023-06-10 DIAGNOSIS — J029 Acute pharyngitis, unspecified: Secondary | ICD-10-CM | POA: Diagnosis not present

## 2023-07-04 ENCOUNTER — Ambulatory Visit: Payer: Medicaid Other

## 2023-07-04 ENCOUNTER — Telehealth: Payer: Medicaid Other | Admitting: Family Medicine

## 2023-07-04 DIAGNOSIS — R300 Dysuria: Secondary | ICD-10-CM | POA: Diagnosis not present

## 2023-07-04 DIAGNOSIS — R3989 Other symptoms and signs involving the genitourinary system: Secondary | ICD-10-CM

## 2023-07-04 DIAGNOSIS — N898 Other specified noninflammatory disorders of vagina: Secondary | ICD-10-CM

## 2023-07-04 DIAGNOSIS — N390 Urinary tract infection, site not specified: Secondary | ICD-10-CM | POA: Diagnosis not present

## 2023-07-04 NOTE — Progress Notes (Signed)
Because you are having several symptoms and some belly/back pain, and dark urine-your condition warrants further evaluation and I recommend that you be seen in a face to face visit.   NOTE: There will be NO CHARGE for this eVisit   If you are having a true medical emergency please call 911.

## 2023-07-08 DIAGNOSIS — Z419 Encounter for procedure for purposes other than remedying health state, unspecified: Secondary | ICD-10-CM | POA: Diagnosis not present

## 2023-08-07 DIAGNOSIS — Z419 Encounter for procedure for purposes other than remedying health state, unspecified: Secondary | ICD-10-CM | POA: Diagnosis not present

## 2023-09-07 DIAGNOSIS — Z419 Encounter for procedure for purposes other than remedying health state, unspecified: Secondary | ICD-10-CM | POA: Diagnosis not present

## 2023-09-13 ENCOUNTER — Ambulatory Visit: Payer: Medicaid Other | Admitting: Dermatology

## 2023-10-07 DIAGNOSIS — Z419 Encounter for procedure for purposes other than remedying health state, unspecified: Secondary | ICD-10-CM | POA: Diagnosis not present

## 2023-10-24 DIAGNOSIS — F4310 Post-traumatic stress disorder, unspecified: Secondary | ICD-10-CM | POA: Diagnosis not present

## 2023-10-25 DIAGNOSIS — F4323 Adjustment disorder with mixed anxiety and depressed mood: Secondary | ICD-10-CM | POA: Diagnosis not present

## 2023-11-07 DIAGNOSIS — Z419 Encounter for procedure for purposes other than remedying health state, unspecified: Secondary | ICD-10-CM | POA: Diagnosis not present

## 2023-12-08 DIAGNOSIS — Z419 Encounter for procedure for purposes other than remedying health state, unspecified: Secondary | ICD-10-CM | POA: Diagnosis not present

## 2023-12-13 IMAGING — CT CT NECK W/ CM
4 of 6 series · 12 of 33 positions shown, 14 images · IV contrast (APPLIED)
Comparison: None

CLINICAL DATA: Epiglottitis or tonsillitis suspected. Sore throat
over the last 3 weeks.

EXAM:
CT NECK WITH CONTRAST
TECHNIQUE: Multidetector CT imaging of the neck was performed using the
standard protocol following the bolus administration of intravenous
contrast.

[Series 4: neck lungs · axial · 0.60mm/px · z∈[-207,-121]mm · 2 of 129 slices shown]
[im 43/129  bone]
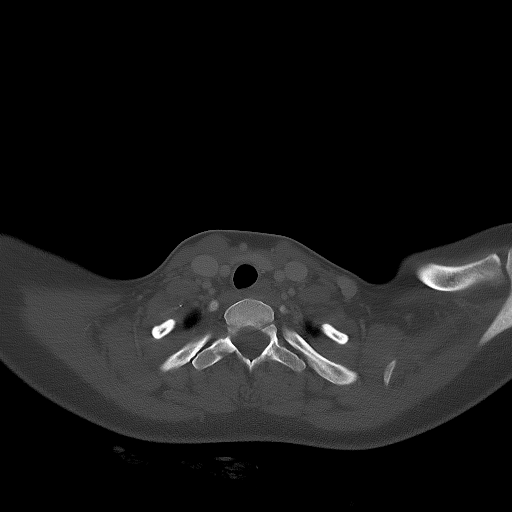
[im 86/129  bone]
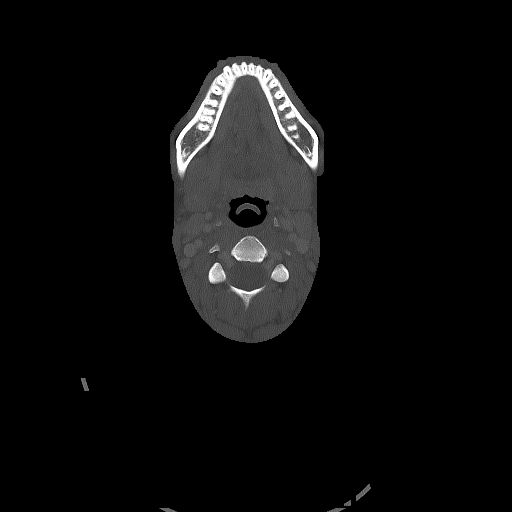

[Series 5: axial bone · axial · 0.60mm/px · z∈[-207,-121]mm · 2 of 129 slices shown, 3 images]
[im 43/129  soft-tissue]
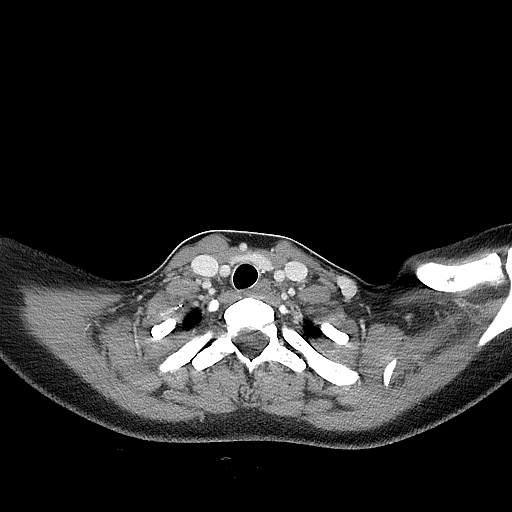
[im 43/129  bone]
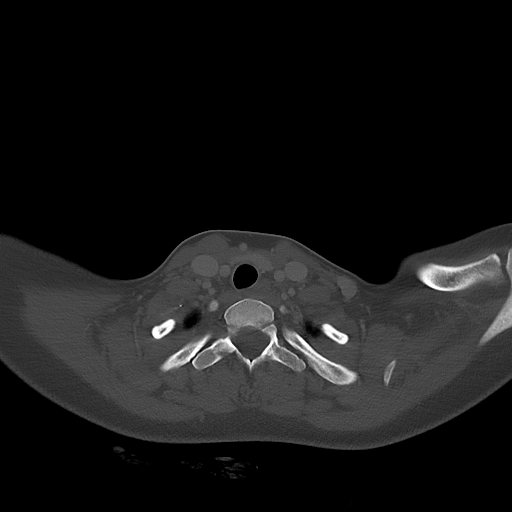
[im 86/129  bone]
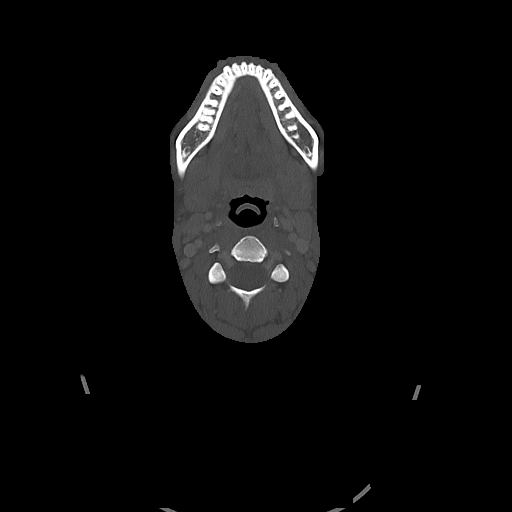

[Series 6: sag neck · sagittal · 0.47mm/px · 5 of 111 slices shown, 6 images]
[im 37/111  bone]
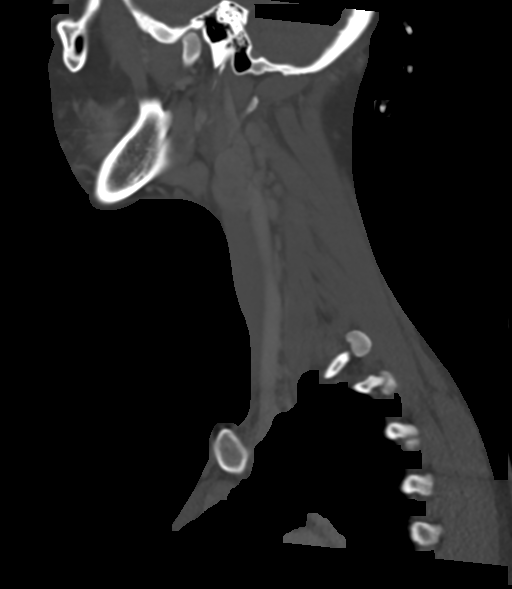
[im 46/111  bone]
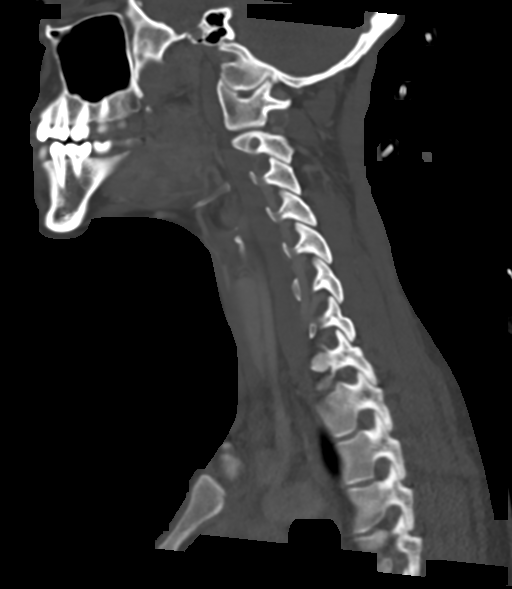
[im 56/111  soft-tissue]
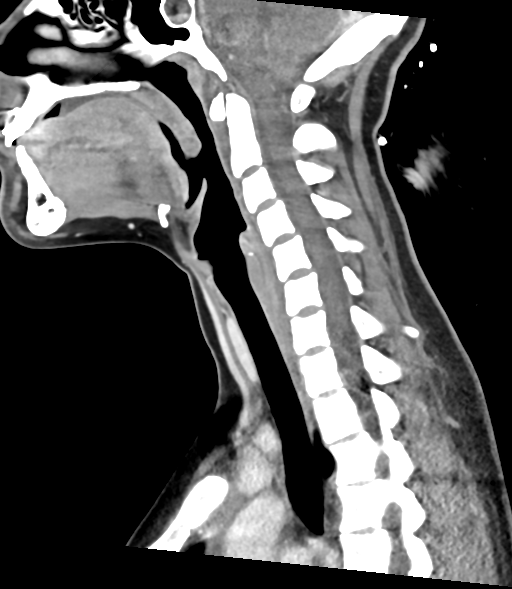
[im 56/111  bone]
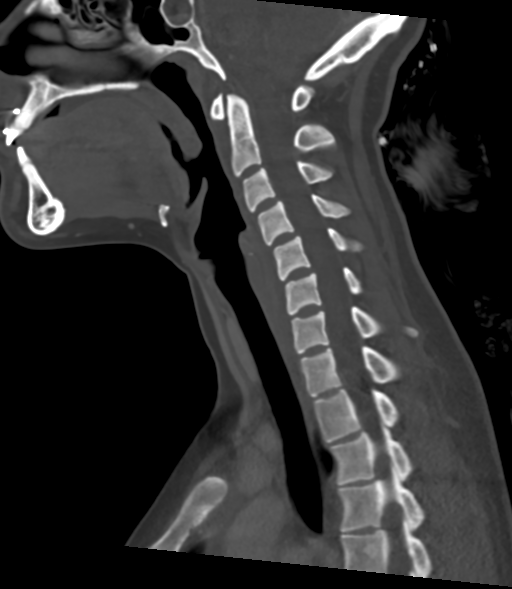
[im 65/111  bone]
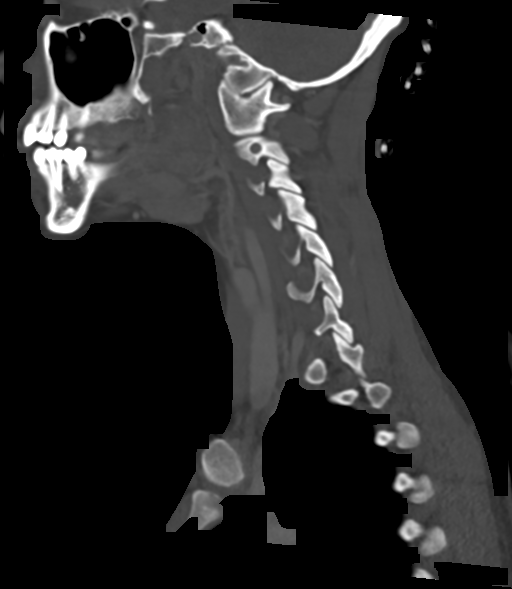
[im 74/111  bone]
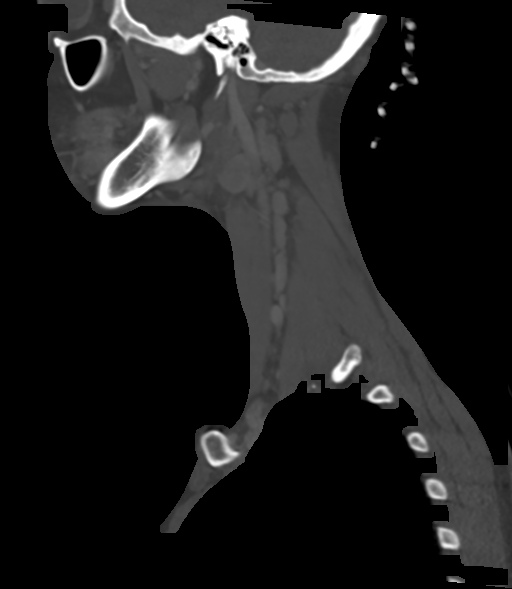

[Series 7: cor neck · coronal · 0.54mm/px · 3 of 146 slices shown]
[im 30/146  bone]
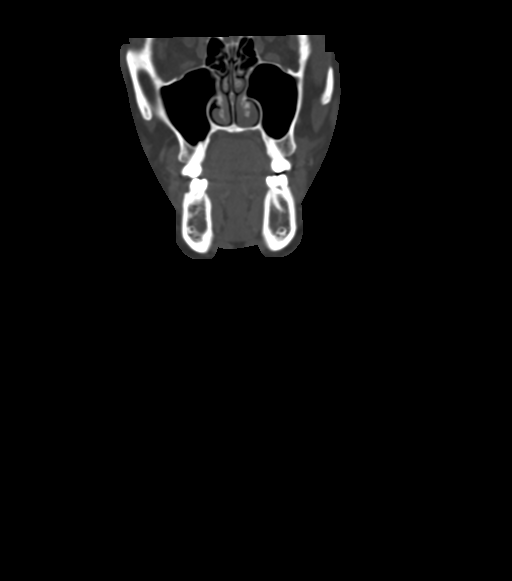
[im 59/146  bone]
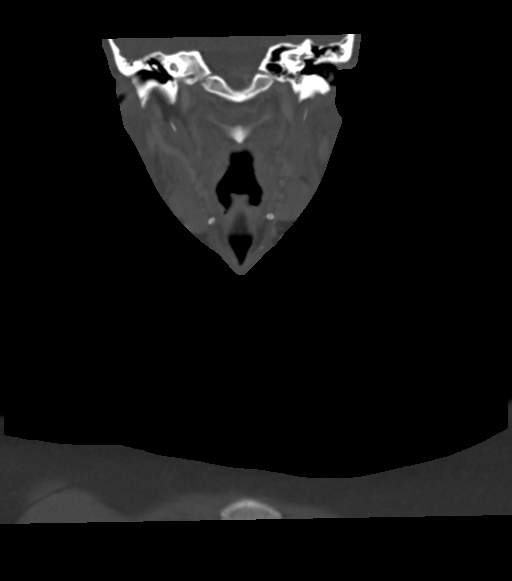
[im 88/146  bone]
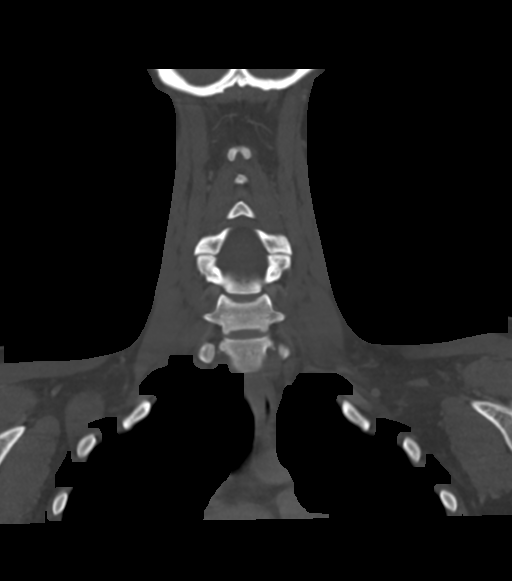

[12 of 33 positions shown; findings below may reference images not displayed]

RADIATION DOSE REDUCTION: This exam was performed according to the
departmental dose-optimization program which includes automated
exposure control, adjustment of the mA and/or kV according to
patient size and/or use of iterative reconstruction technique.

CONTRAST:  75mL OMNIPAQUE IOHEXOL 300 MG/ML  SOLN
FINDINGS: Pharynx and larynx: Findings of bilateral tonsillitis. Swelling
slightly more prominent on the left. No evidence of tonsillar or
peritonsillar abscess. No parapharyngeal inflammatory change.

Salivary glands: Parotid and submandibular glands are normal.

Thyroid: Normal

Lymph nodes: Mild reactive nodal prominence at the level 2 stations
without suppuration.

Vascular: Normal

Limited intracranial: Normal

Visualized orbits: Normal

Mastoids and visualized paranasal sinuses: Clear

Skeleton: Normal

Upper chest: Normal

Other: None
IMPRESSION: Bilateral tonsillitis pattern with slightly more swelling on the
left than the right. No evidence of tonsillar or peritonsillar
abscess or inflammation of the parapharyngeal space.

## 2024-01-05 DIAGNOSIS — Z419 Encounter for procedure for purposes other than remedying health state, unspecified: Secondary | ICD-10-CM | POA: Diagnosis not present

## 2024-02-16 DIAGNOSIS — Z419 Encounter for procedure for purposes other than remedying health state, unspecified: Secondary | ICD-10-CM | POA: Diagnosis not present

## 2024-03-17 DIAGNOSIS — Z419 Encounter for procedure for purposes other than remedying health state, unspecified: Secondary | ICD-10-CM | POA: Diagnosis not present

## 2024-07-12 ENCOUNTER — Other Ambulatory Visit: Payer: Self-pay

## 2024-07-12 ENCOUNTER — Ambulatory Visit
Admission: RE | Admit: 2024-07-12 | Discharge: 2024-07-12 | Disposition: A | Discharge status: Home / Self Care | Payer: MEDICAID | Attending: Internal Medicine

## 2024-07-12 VITALS — BP 131/78 | HR 98 | Temp 97.6°F | Resp 16

## 2024-07-12 DIAGNOSIS — N3000 Acute cystitis without hematuria: Secondary | ICD-10-CM | POA: Diagnosis present

## 2024-07-12 DIAGNOSIS — Z113 Encounter for screening for infections with a predominantly sexual mode of transmission: Secondary | ICD-10-CM | POA: Insufficient documentation

## 2024-07-12 DIAGNOSIS — R300 Dysuria: Secondary | ICD-10-CM | POA: Diagnosis present

## 2024-07-12 DIAGNOSIS — N3289 Other specified disorders of bladder: Secondary | ICD-10-CM | POA: Diagnosis present

## 2024-07-12 LAB — POCT URINE DIPSTICK
Bilirubin, UA: NEGATIVE
Blood, UA: NEGATIVE
Glucose, UA: NEGATIVE mg/dL
Ketones, POC UA: NEGATIVE mg/dL
Leukocytes, UA: NEGATIVE
Nitrite, UA: POSITIVE — AB
POC PROTEIN,UA: NEGATIVE
Spec Grav, UA: 1.025 (ref 1.010–1.025)
Urobilinogen, UA: 0.2 U/dL
pH, UA: 5.5 (ref 5.0–8.0)

## 2024-07-12 LAB — POCT URINE PREGNANCY: Preg Test, Ur: NEGATIVE

## 2024-07-12 MED ORDER — CEFDINIR 300 MG PO CAPS: 300.0000 mg | ORAL_CAPSULE | Freq: Two times a day (BID) | ORAL | 0 refills | Status: AC

## 2024-07-12 NOTE — ED Triage Notes (Signed)
 C/O dysuria x 3 days. Patient states she feels like she is unable to completely empty her bladder. Patient has not taken any OTC meds for symptoms. Denies back or flank pain.

## 2024-07-12 NOTE — ED Provider Notes (Signed)
 [facility]  Note:  This document was prepared using Dragon voice recognition software and may include unintentional dictation errors.  MRN: [id number] DOB: 2006/03/26 DATE: 07/12/24   Subjective:  Chief Complaint:  Chief Complaint  Patient presents with   Urinary Retention    I am having UTI symptoms, and would like to get sti/std testing as well. - Entered by patient   Dysuria     HPI: Lisa Abbott is a 18 y.o. female presenting for dysuria for the past 3 days. Patient states she started with increased urge to urinate 3 days ago. She states she will urinate, but still feels like her bladder is not completely emptied and will continue to have an urge to urinate. She states that she does have a pressure sensation with urinating. She states she experiences a squeezing/tightening of her bladder upon urination. She feels that she has to squeeze to empty her bladder. She reports that this has occurred prior in the past. She and guardian report a history of frequent UTIs which have led to pyelonephritis and sepsis in the past. She was seeing a pediatric urologist in the past, but states it has been about 3 years since she was last seen. At one point, she was on oxybutynin , but stopped due to nausea and possible interaction with psych medications. She also reports being on prophylactic bactrim  daily as well for frequent UTIs, but unsure why it was stopped. Patient requesting STD testing as well. No known exposures. Reports baseline vaginal discharge. Denies fever, nausea/vomiting, back pain, hematuria, vaginal discharge, vaginal lesions, vaginal odor. Endorses dysuria, frequent urination, bladder spasms. Presents NAD.  Prior to Admission medications   Not on File     No Known Allergies  History:   Past Medical History:  Diagnosis Date   History of recurrent UTIs    Pyelonephritis      Past Surgical History:  Procedure Laterality Date   APPENDECTOMY     LAPAROSCOPY N/A  04/05/2023   Procedure: LAPAROSCOPY DIAGNOSTIC, Laparoscopic Appendectomy;  Surgeon: Vanderbilt Ned, MD;  Location: [facility];  Service: General;  Laterality: N/A;   NO PAST SURGERIES      Family History  Problem Relation Age of Onset   Healthy Mother    Diabetes Maternal Grandfather    Stroke Maternal Great-grandmother     Social History   Tobacco Use   Smoking status: Some Days    Types: E-cigarettes   Smokeless tobacco: Never   Tobacco comments:    Encouraged cessation as she wants to use oral combined birth control  Vaping Use   Vaping status: Every Day   Substances: Nicotine  Substance Use Topics   Alcohol use: Yes    Comment: occasional   Drug use: Yes    Frequency: 7.0 times per week    Types: Marijuana    Comment: dailly    Review of Systems  Constitutional:  Negative for fever.  Gastrointestinal:  Negative for abdominal pain, nausea and vomiting.  Genitourinary:  Positive for dysuria, frequency and urgency. Negative for flank pain, genital sores, hematuria and vaginal discharge.  Musculoskeletal:  Negative for back pain.     Objective:   Vitals: BP 131/78 (BP Location: Right Arm)   Pulse 98   Temp 97.6 F (36.4 C) (Oral)   Resp 16   LMP 06/14/2024   SpO2 97%   Physical Exam Constitutional:      General: She is not in acute distress.    Appearance: Normal appearance. She  is well-developed and normal weight. She is not ill-appearing or toxic-appearing.  HENT:     Head: Normocephalic and atraumatic.  Cardiovascular:     Rate and Rhythm: Normal rate and regular rhythm.     Heart sounds: Normal heart sounds.  Pulmonary:     Effort: Pulmonary effort is normal.     Breath sounds: Normal breath sounds.     Comments: Clear to auscultation bilaterally  Abdominal:     General: Bowel sounds are normal.     Palpations: Abdomen is soft.     Tenderness: There is no abdominal tenderness. There is no right CVA tenderness or left CVA tenderness.     Comments:  No acute abdomen   Skin:    General: Skin is warm and dry.  Neurological:     General: No focal deficit present.     Mental Status: She is alert.  Psychiatric:        Mood and Affect: Mood and affect normal.     Results:  Labs: Results for orders placed or performed during the hospital encounter of 07/12/24 (from the past 24 hours)  POCT URINE DIPSTICK     Status: Abnormal   Collection Time: 07/12/24 11:33 AM  Result Value Ref Range   Color, UA yellow yellow   Clarity, UA cloudy (A) clear   Glucose, UA negative negative mg/dL   Bilirubin, UA negative negative   Ketones, POC UA negative negative mg/dL   Spec Grav, UA 8.974 8.989 - 1.025   Blood, UA negative negative   pH, UA 5.5 5.0 - 8.0   POC PROTEIN,UA negative negative, trace   Urobilinogen, UA 0.2 0.2 or 1.0 E.U./dL   Nitrite, UA Positive (A) Negative   Leukocytes, UA Negative Negative  POCT urine pregnancy     Status: None   Collection Time: 07/12/24 11:34 AM  Result Value Ref Range   Preg Test, Ur Negative Negative    Radiology: No results found.   UC Course/Treatments:  Procedures: Procedures   Medications Ordered in UC: Medications - No data to display   Assessment and Plan :     ICD-10-CM   1. Acute cystitis without hematuria  N30.00 Urine Culture    Urine Culture    2. Dysuria  R30.0 POCT urine pregnancy    POCT URINE DIPSTICK    POCT urine pregnancy    POCT URINE DIPSTICK    3. Screening for STDs (sexually transmitted diseases)  Z11.3 Cervicovaginal ancillary only    RPR    HIV Antibody (routine testing w rflx)    HIV4GL Save Tube    Cervicovaginal ancillary only    RPR    HIV Antibody (routine testing w rflx)    HIV4GL Save Tube    4. Bladder spasms  N32.89       Acute Cystitis without Hematuria Dysuria Bladder Spasms Screening for STDs Afebrile, nontoxic-appearing, NAD. VSS. DDX includes but not limited to: Cystitis, IC, pyelonephritis, nephrolithiasis, BV, yeast, STD UPT was  negative. UA positive for nitrites. Guardian states that this frequently occurs for her. Urine culture is pending. Given UA results and history of pyelonephritis, Cefdinir  300mg  BID was prescribed while waiting culture results. Recommend she follow up with urologist. Reports almost 3 years since follow up. Recommend she discuss trying oxybutynin  again versus other treatment options for spasms given nausea with oxybutynin  previously. Cytology, HIV, and RPR are pending. Safe sex precautions advised. Strict ED precautions were given and patient verbalized understanding.   ED Discharge  Orders          Ordered    cefdinir  (OMNICEF ) 300 MG capsule  2 times daily        07/12/24 1146             PDMP not reviewed this encounter.     Bow Buntyn P, PA-C 07/12/24 1159

## 2024-07-12 NOTE — Discharge Instructions (Addendum)
 Your swab and blood work were sent to the lab for further testing.  You will be called with results. You should avoid all sexual activity until you have been notified of all your results and have undergone any necessary treatment.  If you are positive, it is recommended that you inform all sexual partners so they can treat be treated as well before having sex again.   Your urine sample was consistent with a urinary tract infection. You were prescribed Cefdinir  which is an antibiotic that is often used to treat urinary tract infections.  Take the prescription as directed. A urine culture has been sent to the lab for further testing.  We will call you with those results.  If the prescription needs to be changed, it will be done so at that time. Return in 2 to 3 days if no improvement. Please go directly to the Emergency Department immediately should you begin to have any of the following symptoms: persistent fevers, increased pain or persistent nausea/vomiting.

## 2024-07-13 LAB — HIV ANTIBODY (ROUTINE TESTING W REFLEX): HIV Screen 4th Generation wRfx: NONREACTIVE

## 2024-07-13 LAB — RPR: RPR Ser Ql: NONREACTIVE

## 2024-07-14 ENCOUNTER — Ambulatory Visit: Payer: Self-pay

## 2024-07-14 LAB — CERVICOVAGINAL ANCILLARY ONLY
Bacterial Vaginitis (gardnerella): POSITIVE — AB
Candida Glabrata: NEGATIVE
Candida Vaginitis: NEGATIVE
Chlamydia: NEGATIVE
Comment: NEGATIVE
Comment: NEGATIVE
Comment: NEGATIVE
Comment: NEGATIVE
Comment: NEGATIVE
Comment: NORMAL
Neisseria Gonorrhea: NEGATIVE
Trichomonas: NEGATIVE

## 2024-07-14 LAB — URINE CULTURE: Culture: >=100000 — AB

## 2024-07-14 MED ORDER — METRONIDAZOLE 500 MG PO TABS: 500.0000 mg | ORAL_TABLET | Freq: Two times a day (BID) | ORAL | 0 refills | Status: AC

## 2024-09-25 ENCOUNTER — Other Ambulatory Visit: Payer: Self-pay

## 2024-09-25 ENCOUNTER — Ambulatory Visit
Admission: RE | Admit: 2024-09-25 | Discharge: 2024-09-25 | Disposition: A | Discharge status: Home / Self Care | Payer: MEDICAID

## 2024-09-25 VITALS — BP 129/83 | HR 92 | Temp 98.0°F | Resp 18

## 2024-09-25 DIAGNOSIS — T7421XA Adult sexual abuse, confirmed, initial encounter: Secondary | ICD-10-CM
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
## 2024-11-23 ENCOUNTER — Ambulatory Visit: Payer: MEDICAID

## 2024-11-24 ENCOUNTER — Inpatient Hospital Stay: Admission: RE | Admit: 2024-11-24 | Discharge: 2024-11-24 | Payer: MEDICAID | Attending: Internal Medicine

## 2024-11-24 VITALS — BP 119/73 | HR 90 | Temp 97.6°F | Resp 18

## 2024-11-24 DIAGNOSIS — Z202 Contact with and (suspected) exposure to infections with a predominantly sexual mode of transmission: Secondary | ICD-10-CM | POA: Insufficient documentation

## 2024-11-24 DIAGNOSIS — F17290 Nicotine dependence, other tobacco product, uncomplicated: Secondary | ICD-10-CM | POA: Insufficient documentation

## 2024-11-24 DIAGNOSIS — R3914 Feeling of incomplete bladder emptying: Secondary | ICD-10-CM | POA: Diagnosis not present

## 2024-11-24 DIAGNOSIS — Z87440 Personal history of urinary (tract) infections: Secondary | ICD-10-CM | POA: Diagnosis not present

## 2024-11-24 DIAGNOSIS — R339 Retention of urine, unspecified: Secondary | ICD-10-CM | POA: Insufficient documentation

## 2024-11-24 DIAGNOSIS — Z113 Encounter for screening for infections with a predominantly sexual mode of transmission: Secondary | ICD-10-CM | POA: Insufficient documentation

## 2024-11-24 LAB — POCT URINE DIPSTICK
Bilirubin, UA: NEGATIVE
Glucose, UA: NEGATIVE mg/dL
Nitrite, UA: NEGATIVE
POC PROTEIN,UA: NEGATIVE
Spec Grav, UA: 1.025
Urobilinogen, UA: 0.2 U/dL
pH, UA: 6

## 2024-11-24 MED ORDER — SULFAMETHOXAZOLE-TRIMETHOPRIM 800-160 MG PO TABS: 1.0000 | ORAL_TABLET | Freq: Two times a day (BID) | ORAL | 0 refills | Status: AC

## 2024-11-24 NOTE — Discharge Instructions (Signed)
 I have sent in an antibiotic to treat UTI. Urine culture and STD testing is pending. We will call when it results. Please follow up if any symptoms persist or worsen.

## 2024-11-24 NOTE — ED Triage Notes (Signed)
 Pt states she doesn't feel like she is emptying the bladder when she urinates. Denies any other symptoms. Wants to be screened for STI and UTI.

## 2024-11-24 NOTE — ED Provider Notes (Signed)
 [facility]    CSN: [id number] Arrival date & time: 11/24/24  1632      History   Chief Complaint Chief Complaint  Patient presents with   Exposure to STD    I was not able to make my appointment yesterday. I just need all of the STD screenings due to having been with a new sexual partner recently. - Entered by patient    HPI Lisa Abbott is a 19 y.o. female.   Patient presents with mother who helps provide history. She reports 1 day history of feeling like she is not able to completely empty her bladder. Denies urinary burning, urinary frequency, hematuria, vaginal discharge, back pain, fever. Reports history of frequent UTI's that occur every few months. Her last UTI was in September. She has seen a urologist in the past where she had several procedures that did not show any obvious cause. She is requesting STD testing as well to ensure her symptoms are not related to that, although she denies any exposure to STD. She reports a new sexual partner, and she did have unprotected sexual intercourse. Last menstrual cycle was 11/14/23. Patient's chart has a banner labeled legal guardian. Although, patient and her parent deny that she has ever had a legal guardian. Review of the chart reveals that patient has been making her own medical decisions as well.    Exposure to STD    Past Medical History:  Diagnosis Date   History of recurrent UTIs    Pyelonephritis     Patient Active Problem List   Diagnosis Date Noted   Cecitis 04/04/2023   Abdominal pain 04/04/2023   Change in facial mole 10/17/2022   Marijuana abuse, continuous 10/17/2022   Severe episode of recurrent major depressive disorder, without psychotic features (HCC) 10/16/2022   Anxiety 10/16/2022   Irregular menses 05/12/2021   History of pyelonephritis 05/12/2021   Menorrhagia 07/29/2019   Migraine without aura and without status migrainosus, not intractable 07/28/2019   Episodic tension-type headache, not  intractable 07/28/2019   Poor sleep hygiene 07/28/2019   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 Authorizing Provider  sulfamethoxazole -trimethoprim  (BACTRIM  DS) 800-160 MG tablet Take 1 tablet by mouth 2 (two) times daily for 3 days. 11/24/24 11/27/24 Yes Hazen Darryle BRAVO, FNP    Family History Family History  Problem Relation Age of Onset   Healthy Mother    Diabetes Maternal Grandfather    Stroke Maternal Great-grandmother     Social History Social History[1]   Allergies   Patient has no known allergies.   Review of Systems Review of Systems Per HPI  Physical Exam Triage Vital Signs ED Triage Vitals  Encounter Vitals  Group     BP 11/24/24 1640 119/73     Girls Systolic BP Percentile --      Girls Diastolic BP Percentile --      Boys Systolic BP Percentile --      Boys Diastolic BP Percentile --      Pulse Rate 11/24/24 1640 90     Resp 11/24/24 1640 18     Temp 11/24/24 1640 97.6 F (36.4 C)     Temp Source 11/24/24 1640 Oral     SpO2 11/24/24 1640 98 %     Weight --      Height --      Head Circumference --      Peak Flow --      Pain Score 11/24/24 1638 0     Pain Loc --      Pain Education --      Exclude from Growth Chart --    No data found.  Updated Vital Signs BP 119/73 (BP Location: Right Arm)   Pulse 90   Temp 97.6 F (36.4 C) (Oral)   Resp 18   LMP 11/13/2024 (Exact Date)   SpO2 98%   Visual  Acuity Right Eye Distance:   Left Eye Distance:   Bilateral Distance:    Right Eye Near:   Left Eye Near:    Bilateral Near:     Physical Exam Constitutional:      General: She is not in acute distress.    Appearance: Normal appearance. She is not toxic-appearing or diaphoretic.  HENT:     Head: Normocephalic and atraumatic.  Eyes:     Extraocular Movements: Extraocular movements intact.     Conjunctiva/sclera: Conjunctivae normal.  Pulmonary:     Effort: Pulmonary effort is normal.  Genitourinary:    Comments: Deferred with shared decision making. Self swab performed.  Neurological:     General: No focal deficit present.     Mental Status: She is alert and oriented to person, place, and time. Mental status is at baseline.  Psychiatric:        Mood and Affect: Mood normal.        Behavior: Behavior normal.        Thought Content: Thought content normal.        Judgment: Judgment normal.      UC Treatments / Results  Labs (all labs ordered are listed, but only abnormal results are displayed) Labs Reviewed  POCT URINE DIPSTICK - Abnormal; Notable for the following components:      Result Value   Clarity, UA cloudy (*)    Ketones, POC UA small (15) (*)    Blood, UA trace-intact (*)    Leukocytes, UA Small (1+) (*)    All other components within normal limits  URINE CULTURE  SYPHILIS: RPR W/REFLEX TO RPR TITER AND TREPONEMAL ANTIBODIES, TRADITIONAL SCREENING AND DIAGNOSIS ALGORITHM  HIV ANTIBODY (ROUTINE TESTING W REFLEX)  CERVICOVAGINAL ANCILLARY ONLY    EKG   Radiology No results found.  Procedures Procedures (including critical care time)  Medications Ordered in UC Medications - No data to display  Initial Impression / Assessment and Plan / UC Course  I have reviewed the triage vital signs and the nursing notes.  Pertinent labs & imaging results that were available during my care of the patient were reviewed by me and considered in my medical decision  making (see chart for details).     UA showing small amount of leuks. Given associated symptoms  and history of frequent UTI's, will opt to treat with bactrim  while awaiting urine culture. Cervicovaginal swab, HIV, RPR also pending. Awaiting these results prior to treatment given no confirmed exposure to STD. Advised to ensure she is drinking plenty of water. Advised strict return precautions. Patient verbalized understanding and was agreeable with plan.  Final Clinical Impressions(s) / UC Diagnoses   Final diagnoses:  Screening examination for venereal disease  Feeling of incomplete bladder emptying     Discharge Instructions      I have sent in an antibiotic to treat UTI. Urine culture and STD testing is pending. We will call when it results. Please follow up if any symptoms persist or worsen.     ED Prescriptions     Medication Sig Dispense Auth. Provider   sulfamethoxazole -trimethoprim  (BACTRIM  DS) 800-160 MG tablet Take 1 tablet by mouth 2 (two) times daily for 3 days. 6 tablet [place], Elinore Shults E, [place]      PDMP not reviewed this encounter.    [1]  Social History Tobacco Use   Smoking status: Some Days    Types: E-cigarettes   Smokeless tobacco: Never   Tobacco comments:    Encouraged cessation as she wants to use oral combined birth control  Vaping Use   Vaping status: Every Day   Substances: Nicotine  Substance Use Topics   Alcohol use: Yes    Comment: occasional   Drug use: Yes    Frequency: 7.0 times per week    Types: Marijuana    Comment: charlynn Hazen Darryle FORBES, [place] 11/24/24 1750  "

## 2024-11-25 LAB — HIV ANTIBODY (ROUTINE TESTING W REFLEX): HIV Screen 4th Generation wRfx: NONREACTIVE

## 2024-11-25 LAB — SYPHILIS: RPR W/REFLEX TO RPR TITER AND TREPONEMAL ANTIBODIES, TRADITIONAL SCREENING AND DIAGNOSIS ALGORITHM: RPR Ser Ql: NONREACTIVE

## 2024-11-26 ENCOUNTER — Ambulatory Visit (HOSPITAL_COMMUNITY): Payer: Self-pay

## 2024-11-26 LAB — CERVICOVAGINAL ANCILLARY ONLY
Bacterial Vaginitis (gardnerella): POSITIVE — AB
Candida Glabrata: NEGATIVE
Candida Vaginitis: NEGATIVE
Chlamydia: NEGATIVE
Comment: NEGATIVE
Comment: NEGATIVE
Comment: NEGATIVE
Comment: NEGATIVE
Comment: NEGATIVE
Comment: NORMAL
Neisseria Gonorrhea: NEGATIVE
Trichomonas: NEGATIVE

## 2024-11-26 LAB — URINE CULTURE: Culture: >=100000 — AB

## 2024-11-26 MED ORDER — METRONIDAZOLE 500 MG PO TABS: 500.0000 mg | ORAL_TABLET | Freq: Two times a day (BID) | ORAL | 0 refills | Status: AC

## 2024-12-02 ENCOUNTER — Telehealth: Payer: MEDICAID

## 2024-12-02 DIAGNOSIS — H538 Other visual disturbances: Secondary | ICD-10-CM | POA: Diagnosis not present

## 2024-12-02 DIAGNOSIS — R209 Unspecified disturbances of skin sensation: Secondary | ICD-10-CM | POA: Diagnosis not present

## 2024-12-02 NOTE — Progress Notes (Signed)
 " Virtual Visit Consent   Lisa Abbott, you are scheduled for a virtual visit with a [facility] provider today. Just as with appointments in the office, your consent must be obtained to participate. Your consent will be active for this visit and any virtual visit you may have with one of our providers in the next 365 days. If you have a MyChart account, a copy of this consent can be sent to you electronically.  As this is a virtual visit, video technology does not allow for your provider to perform a traditional examination. This may limit your provider's ability to fully assess your condition. If your provider identifies any concerns that need to be evaluated in person or the need to arrange testing (such as labs, EKG, etc.), we will make arrangements to do so. Although advances in technology are sophisticated, we cannot ensure that it will always work on either your end or our end. If the connection with a video visit is poor, the visit may have to be switched to a telephone visit. With either a video or telephone visit, we are not always able to ensure that we have a secure connection.  By engaging in this virtual visit, you consent to the provision of healthcare and authorize for your insurance to be billed (if applicable) for the services provided during this visit. Depending on your insurance coverage, you may receive a charge related to this service.  I need to obtain your verbal consent now. Are you willing to proceed with your visit today? Lisa Abbott has provided verbal consent on 12/02/2024 for a virtual visit (video or telephone). [name], NP  Date: 12/02/2024 12:24 PM   Virtual Visit via Video Note   I, [name], connected with  Lisa Abbott  ([id number], Mar 18, 2006) on 12/02/24 at 12:00 PM EST by a video-enabled telemedicine application and verified that I am speaking with the correct person using two identifiers.  Location: Patient: Virtual Visit Location Patient:  Home Provider: Virtual Visit Location Provider: Home Office   I discussed the limitations of evaluation and management by telemedicine and the availability of in person appointments. The patient expressed understanding and agreed to proceed.    History of Present Illness: Lisa Abbott is a 19 y.o. who identifies as a female who was assigned female at birth, and is being seen today for L side temple and side of head sensation x2 days. Sensation is intermittent. Is not painful and it not a migraine - she gets migraines and they do not feel like this. She has had before but only for 30 sec, but now ongoing intermittently for 2 days.   Feels like something is touching her skin of her L forehead/temple and L side of head. She says it feels like she has bangs touching her forehead but she does not have bangs. She can press on a small bumpy area on her L forehead and make the senstation stop. No rash, redness, blisters. AGain, no pain. Sometimes her L eyebrow twitches  No head injury.   No new change in vision in last 2 days. Vision blurry in L eye for last few months, now starting in R eye - has not seen eye dr - has appt in April. No blurry vision up close, only for distance vision  No eye pain or redness.    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  Resting comfortably  at home.  Head is normocephalic, atraumatic.  No labored breathing.  Speech is clear and coherent with logical content.  Patient is alert and oriented at baseline.  Normal facial symmetry  Assessment and Plan: 1. Unspecified disturbances of skin sensation (Primary)  Needs to see eye dr. I am not sure about sensation change in L forehead/temple/side of head. I suggested a brief trial of ibuprofen  in case some inflammation is putting pressure on a nerve, causing sensation something is touching her. If sx continue, will need to be seen in person. If develops eye pain and redness, will need to be seen urgently.   Follow Up Instructions: I discussed the assessment and treatment plan with the patient. The patient was provided an opportunity to ask questions and all were answered. The patient agreed with the plan and demonstrated an understanding of the instructions.  A copy of instructions were sent to the patient via MyChart unless otherwise noted below.   The patient was advised to call back or seek an in-person evaluation if the symptoms worsen or if the condition fails to improve as anticipated.    [name], NP    [1] No Known Allergies [2]  Current Outpatient Medications:    metroNIDAZOLE  (FLAGYL ) 500 MG tablet, Take 1 tablet (500 mg total) by mouth 2 (two) times daily for 7 days., Disp: 14 tablet, Rfl: 0  "

## 2024-12-02 NOTE — Patient Instructions (Signed)
" °  Lisa Abbott, thank you for joining [name], NP for today's virtual visit.  While this provider is not your primary care provider (PCP), if your PCP is located in our provider database this encounter information will be shared with them immediately following your visit.   A [facility] MyChart account gives you access to today's visit and all your visits, tests, and labs performed at [facility]  click here if you don't have a [place] [facility] MyChart account or go to [email or web address]  Consent: (Patient) Lisa Abbott provided verbal consent for this virtual visit at the beginning of the encounter.  Current Medications:  Current Outpatient Medications:    metroNIDAZOLE  (FLAGYL ) 500 MG tablet, Take 1 tablet (500 mg total) by mouth 2 (two) times daily for 7 days., Disp: 14 tablet, Rfl: 0   Medications ordered in this encounter:  No orders of the defined types were placed in this encounter.    *If you need refills on other medications prior to your next appointment, please contact your pharmacy*  Follow-Up: Call back or seek an in-person evaluation if the symptoms worsen or if the condition fails to improve as anticipated.  [facility] [phone]  Other Instructions  Keep eye dr appointment for April. If one or both of your eyes become painful or red and painful, please get seen in person immediately, such as at an urgent care.   Try a trial of ibuprofen  to see if that relieves strange sensation in left forehead/side of head. If it does not, you will need to be seen in person, such as by a primary care provider.    If you have been instructed to have an in-person evaluation today at a local Urgent Care facility, please use the link below. It will take you to a list of all of our available [facility], including address, phone number and hours of operation. Please do not delay care.  [facility]  If you or a family  member do not have a primary care provider, use the link below to schedule a visit and establish care. When you choose a [facility] primary care physician or advanced practice provider, you gain a long-term partner in health. Find a Primary Care Provider  Learn more about [facility] in-office and virtual care options: [facility] - Get Care Now  "
